# Patient Record
Sex: Female | Born: 1990 | Race: Black or African American | Hispanic: No | Marital: Single | Smoking: Light tobacco smoker
Health system: Southern US, Community
[De-identification: ages and names within clinical notes are randomized; demographics above are authoritative.]

## PROBLEM LIST (undated history)

## (undated) ENCOUNTER — Inpatient Hospital Stay: Payer: Self-pay

## (undated) DIAGNOSIS — O09299 Supervision of pregnancy with other poor reproductive or obstetric history, unspecified trimester: Secondary | ICD-10-CM

## (undated) DIAGNOSIS — I10 Essential (primary) hypertension: Secondary | ICD-10-CM

## (undated) DIAGNOSIS — K219 Gastro-esophageal reflux disease without esophagitis: Secondary | ICD-10-CM

## (undated) DIAGNOSIS — R569 Unspecified convulsions: Secondary | ICD-10-CM

## (undated) DIAGNOSIS — F329 Major depressive disorder, single episode, unspecified: Secondary | ICD-10-CM

## (undated) DIAGNOSIS — F32A Depression, unspecified: Secondary | ICD-10-CM

## (undated) DIAGNOSIS — E669 Obesity, unspecified: Secondary | ICD-10-CM

## (undated) DIAGNOSIS — E119 Type 2 diabetes mellitus without complications: Secondary | ICD-10-CM

## (undated) DIAGNOSIS — R8271 Bacteriuria: Secondary | ICD-10-CM

## (undated) HISTORY — PX: THERAPEUTIC ABORTION: SHX798

## (undated) HISTORY — PX: CHOLECYSTECTOMY: SHX55

---
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
## 2009-07-09 ENCOUNTER — Emergency Department: Payer: Self-pay | Admitting: Emergency Medicine

## 2009-09-16 ENCOUNTER — Encounter: Payer: Self-pay | Admitting: Emergency Medicine

## 2009-09-18 ENCOUNTER — Ambulatory Visit: Payer: Self-pay | Admitting: Psychiatry

## 2009-09-18 ENCOUNTER — Inpatient Hospital Stay (HOSPITAL_COMMUNITY): Admission: AD | Admit: 2009-09-18 | Discharge: 2009-09-20 | Payer: Self-pay | Admitting: Psychiatry

## 2009-11-19 ENCOUNTER — Emergency Department: Payer: Self-pay | Admitting: Emergency Medicine

## 2010-01-29 ENCOUNTER — Emergency Department: Payer: Self-pay | Admitting: Emergency Medicine

## 2010-01-31 ENCOUNTER — Emergency Department: Payer: Self-pay | Admitting: Emergency Medicine

## 2010-03-14 HISTORY — PX: CHOLECYSTECTOMY, LAPAROSCOPIC: SHX56

## 2010-03-20 ENCOUNTER — Emergency Department (HOSPITAL_COMMUNITY)
Admission: EM | Admit: 2010-03-20 | Discharge: 2010-03-20 | Payer: Self-pay | Source: Home / Self Care | Admitting: Emergency Medicine

## 2010-04-25 NOTE — Discharge Summary (Signed)
  NAMESAYDE, LISH NO.:  [id number]  MEDICAL RECORD NO.:  [id number]          PATIENT TYPE:  EMS  LOCATION:  ED                           FACILITY:  [facility]  PHYSICIAN:  Eulogio Ditch, MD DATE OF BIRTH:  04-04-1990  DATE OF ADMISSION:  03/20/2010 DATE OF DISCHARGE:  03/20/2010                              DISCHARGE SUMMARY   DISCHARGE SUMMARY:  A 20 year old single African American female who was admitted from [facility] ER and she reported depressed mood and was using marijuana.  She was having some visual hallucinations but as per the patient it was a misunderstanding.  Please refer to the initial psych assessment for other information.  HOSPITAL COURSE:  During the hospital stay, no medications were started. The patient's family was contacted and there was no safety concern the patient was discharged on September 20, 2009 to be followed in the outpatient setting.  DISCHARGE FOLLOWUP:  [facility] phone number [phone], appointment September 21, 2009 at 10:00 a.m..  DISCHARGE DIAGNOSES:  AXIS I: Psychosis not otherwise specified, marijuana abuse. AXIS II:  Borderline  IQ. AXIS III: preeclampsia. AXIS IV:  Psychosocial issues. AXIS V: Global assessment of functioning 60.  The patient was seen by the weekend team and was found to be stable for discharge.     Eulogio Ditch, MD     SA/MEDQ  D:  04/22/2010  T:  04/22/2010  Job:  [id number]  Electronically Signed by Eulogio Ditch  on 04/25/2010 01:57:08 PM

## 2010-05-01 ENCOUNTER — Emergency Department: Payer: Self-pay | Admitting: Emergency Medicine

## 2010-05-13 ENCOUNTER — Encounter: Payer: Self-pay | Admitting: Obstetrics and Gynecology

## 2010-05-18 NOTE — Discharge Summary (Addendum)
[name], [name]                ACCOUNT NO.:  [id number]  MEDICAL RECORD NO.:  [id number]           PATIENT TYPE:  LOCATION:                                 FACILITY:  PHYSICIAN:  Eulogio Ditch, MD DATE OF BIRTH:  1990-11-22  DATE OF ADMISSION:  09/18/2009 DATE OF DISCHARGE:  09/20/2009                              DISCHARGE SUMMARY   IDENTIFYING INFORMATION:  This is a 20 year old African American female. This is a voluntary admission.  HISTORY OF THE PRESENT ILLNESS:  [name] represented by way of our emergency room requesting to speak to somebody about her depression and was complaining of having chest pain that had been occurring for about 2 months.  She did endorse smoking marijuana for the first time within the previous week.  She reported that she was having problems with anxiety and stress due to family issues and her relationship with the father of her child.  She was admitted for stabilization.  FINDINGS:  She was admitted to our mood disorders program and admitted to having significant anxiety and some anger toward the father of her child and was concerned that she might harm him.  She was noted to be nonpsychotic and denied any intent to actually harm anyone and no suicidal thoughts.  She agreed to pursue outpatient counseling and was referred to [facility] and was to report there on July the 11th at 10:00 a.m. for a follow-up appointment.  She is in full contact with reality.  No dangerous ideas.  DISCHARGE CONDITION:  Stable.  DISCHARGE DIAGNOSES:  Axis I:  Depressive disorder not otherwise specified. Axis II:  No diagnosis. Axis III:  No diagnosis. Axis IV:  Ongoing parenting issues. Axis V:  Current 61.  Past year not known.  DISCHARGE MEDICATIONS:  She was discharged on no medications.     Margaret A. Lorin Picket, N.P.   ______________________________ Eulogio Ditch, MD    MAS/MEDQ  D:  05/14/2010  T:  05/14/2010  Job:   [id number]  Electronically Signed by Kari Baars N.P. on 05/17/2010 01:34:57 PM Electronically Signed by Eulogio Ditch  on 05/18/2010 09:57:44 AM Electronically Signed by Eulogio Ditch  on 05/18/2010 10:05:34 AM Electronically Signed by Eulogio Ditch  on 05/18/2010 10:14:47 AM Electronically Signed by Eulogio Ditch  on 05/18/2010 10:23:58 AM Electronically Signed by Eulogio Ditch  on 05/18/2010 10:32:58 AM Electronically Signed by Eulogio Ditch  on 05/18/2010 10:42:00 AM Electronically Signed by Eulogio Ditch  on 05/18/2010 10:51:29 AM Electronically Signed by Eulogio Ditch  on 05/18/2010 11:01:48 AM Electronically Signed by Eulogio Ditch  on 05/18/2010 11:11:42 AM Electronically Signed by Eulogio Ditch  on 05/18/2010 11:23:04 AM Electronically Signed by Eulogio Ditch  on 05/18/2010 11:36:07 AM Electronically Signed by Eulogio Ditch  on 05/18/2010 11:49:28 AM Electronically Signed by Eulogio Ditch  on 05/18/2010 12:03:57 PM Electronically Signed by Eulogio Ditch  on 05/18/2010 12:18:24 PM Electronically Signed by Eulogio Ditch  on 05/18/2010 12:34:39 PM Electronically Signed by Eulogio Ditch  on 05/18/2010 12:52:38 PM Electronically Signed by Eulogio Ditch  on 05/18/2010 01:10:39 PM Electronically Signed by Eulogio Ditch  on 05/18/2010 01:32:08 PM  Electronically Signed by Eulogio Ditch  on 05/18/2010 01:52:52 PM Electronically Signed by Eulogio Ditch  on 05/18/2010 02:12:47 PM Electronically Signed by Eulogio Ditch  on 05/18/2010 02:33:44 PM Electronically Signed by Eulogio Ditch  on 05/18/2010 02:57:06 PM Electronically Signed by Eulogio Ditch  on 05/18/2010 03:21:58 PM Electronically Signed by Eulogio Ditch  on 05/18/2010 03:46:14 PM Electronically Signed by Eulogio Ditch  on 05/18/2010 04:23:58 PM Electronically Signed by Eulogio Ditch  on 05/18/2010 04:55:16  PM Electronically Signed by Eulogio Ditch  on 05/18/2010 07:43:20 PM

## 2010-05-30 LAB — BASIC METABOLIC PANEL
BUN: 7 mg/dL (ref 6–23)
CO2: 24 mEq/L (ref 19–32)
Chloride: 109 mEq/L (ref 96–112)
Creatinine, Ser: 0.66 mg/dL (ref 0.4–1.2)
GFR calc non Af Amer: >60 mL/min (ref >60)

## 2010-05-30 LAB — CBC
Hemoglobin: 13.1 g/dL (ref 12.0–15.0)
MCH: 30.2 pg (ref 26.0–34.0)
MCV: 89 fL (ref 78.0–100.0)
RBC: 4.35 MIL/uL (ref 3.87–5.11)
WBC: 7.1 10*3/uL (ref 4.0–10.5)

## 2010-05-30 LAB — DIFFERENTIAL
Eosinophils Relative: 1 % (ref 0–5)
Lymphocytes Relative: 27 % (ref 12–46)
Monocytes Absolute: 0.3 10*3/uL (ref 0.1–1.0)
Monocytes Relative: 4 % (ref 3–12)
Neutro Abs: 4.8 10*3/uL (ref 1.7–7.7)
Neutrophils Relative %: 68 % (ref 43–77)

## 2010-05-30 LAB — RAPID URINE DRUG SCREEN, HOSP PERFORMED
Amphetamines: NOT DETECTED
Barbiturates: NOT DETECTED
Benzodiazepines: NOT DETECTED
Tetrahydrocannabinol: POSITIVE — AB

## 2010-05-30 LAB — URINALYSIS, ROUTINE W REFLEX MICROSCOPIC
Ketones, ur: NEGATIVE mg/dL
Leukocytes, UA: NEGATIVE
Nitrite: NEGATIVE

## 2010-05-30 LAB — URINE MICROSCOPIC-ADD ON

## 2010-07-04 ENCOUNTER — Observation Stay: Payer: Self-pay | Admitting: Obstetrics and Gynecology

## 2010-07-15 ENCOUNTER — Encounter: Payer: Self-pay | Admitting: Obstetrics & Gynecology

## 2010-07-22 ENCOUNTER — Encounter: Payer: Self-pay | Admitting: Obstetrics and Gynecology

## 2010-08-05 ENCOUNTER — Encounter: Payer: Self-pay | Admitting: Obstetrics & Gynecology

## 2010-08-12 ENCOUNTER — Encounter: Payer: Self-pay | Admitting: Maternal and Fetal Medicine

## 2010-08-16 ENCOUNTER — Encounter: Payer: Self-pay | Admitting: Obstetrics and Gynecology

## 2010-08-19 ENCOUNTER — Encounter: Payer: Self-pay | Admitting: Maternal & Fetal Medicine

## 2010-08-23 ENCOUNTER — Encounter: Payer: Self-pay | Admitting: Obstetrics & Gynecology

## 2010-08-26 ENCOUNTER — Encounter: Payer: Self-pay | Admitting: Obstetrics and Gynecology

## 2010-08-30 ENCOUNTER — Encounter: Payer: Self-pay | Admitting: Obstetrics and Gynecology

## 2010-09-08 ENCOUNTER — Observation Stay: Payer: Self-pay | Admitting: Obstetrics & Gynecology

## 2010-09-09 ENCOUNTER — Encounter: Payer: Self-pay | Admitting: Maternal and Fetal Medicine

## 2010-09-13 ENCOUNTER — Encounter: Payer: Self-pay | Admitting: Maternal & Fetal Medicine

## 2010-09-16 ENCOUNTER — Encounter: Payer: Self-pay | Admitting: Obstetrics and Gynecology

## 2010-09-17 ENCOUNTER — Inpatient Hospital Stay: Payer: Self-pay

## 2010-09-25 ENCOUNTER — Emergency Department: Payer: Self-pay | Admitting: Emergency Medicine

## 2011-02-02 ENCOUNTER — Emergency Department: Payer: Self-pay | Admitting: Emergency Medicine

## 2011-02-19 ENCOUNTER — Emergency Department: Payer: Self-pay | Admitting: Internal Medicine

## 2011-03-16 ENCOUNTER — Emergency Department: Payer: Self-pay | Admitting: Emergency Medicine

## 2011-03-16 LAB — CBC
HCT: 35 % (ref 35.0–47.0)
HGB: 11.6 g/dL — ABNORMAL LOW (ref 12.0–16.0)
MCH: 29.1 pg (ref 26.0–34.0)
MCHC: 33.2 g/dL (ref 32.0–36.0)

## 2011-03-16 LAB — URINALYSIS, COMPLETE
Bacteria: NONE SEEN
Bilirubin,UR: NEGATIVE
Glucose,UR: NEGATIVE mg/dL (ref 0–75)
Ketone: NEGATIVE
Leukocyte Esterase: NEGATIVE
RBC,UR: NONE SEEN /HPF (ref 0–5)
Squamous Epithelial: 2
WBC UR: <1 /HPF (ref 0–5)

## 2011-03-16 LAB — COMPREHENSIVE METABOLIC PANEL
Albumin: 3.8 g/dL (ref 3.4–5.0)
BUN: 11 mg/dL (ref 7–18)
Bilirubin,Total: 0.2 mg/dL (ref 0.2–1.0)
Chloride: 110 mmol/L — ABNORMAL HIGH (ref 98–107)
Co2: 27 mmol/L (ref 21–32)
Creatinine: 0.81 mg/dL (ref 0.60–1.30)
EGFR (African American): >60
SGPT (ALT): 17 U/L
Sodium: 145 mmol/L (ref 136–145)
Total Protein: 7.4 g/dL (ref 6.4–8.2)

## 2011-03-16 LAB — LIPASE, BLOOD: Lipase: 145 U/L (ref 73–393)

## 2011-03-16 LAB — PREGNANCY, URINE: Pregnancy Test, Urine: NEGATIVE m[IU]/mL

## 2011-04-30 ENCOUNTER — Emergency Department: Payer: Self-pay | Admitting: Emergency Medicine

## 2011-04-30 LAB — CBC
HCT: 37.1 % (ref 35.0–47.0)
MCH: 29 pg (ref 26.0–34.0)
MCHC: 32.7 g/dL (ref 32.0–36.0)
MCV: 89 fL (ref 80–100)
RDW: 14.8 % — ABNORMAL HIGH (ref 11.5–14.5)
WBC: 6.7 10*3/uL (ref 3.6–11.0)

## 2011-04-30 LAB — COMPREHENSIVE METABOLIC PANEL
Albumin: 4.2 g/dL (ref 3.4–5.0)
Anion Gap: 11 (ref 7–16)
BUN: 9 mg/dL (ref 7–18)
Bilirubin,Total: 0.2 mg/dL (ref 0.2–1.0)
Calcium, Total: 8.8 mg/dL (ref 8.5–10.1)
Chloride: 105 mmol/L (ref 98–107)
Co2: 27 mmol/L (ref 21–32)
Creatinine: 0.9 mg/dL (ref 0.60–1.30)
EGFR (African American): >60
EGFR (Non-African Amer.): >60
Potassium: 3.5 mmol/L (ref 3.5–5.1)
Total Protein: 8.1 g/dL (ref 6.4–8.2)

## 2011-04-30 LAB — LIPASE, BLOOD: Lipase: 182 U/L (ref 73–393)

## 2011-06-05 ENCOUNTER — Emergency Department: Payer: Self-pay | Admitting: Unknown Physician Specialty

## 2011-06-05 LAB — LIPASE, BLOOD: Lipase: 122 U/L (ref 73–393)

## 2011-06-05 LAB — CBC
HCT: 35.3 % (ref 35.0–47.0)
HGB: 11.6 g/dL — ABNORMAL LOW (ref 12.0–16.0)
MCH: 29.3 pg (ref 26.0–34.0)
MCHC: 33 g/dL (ref 32.0–36.0)
MCV: 89 fL (ref 80–100)
Platelet: 296 10*3/uL (ref 150–440)
RDW: 14.5 % (ref 11.5–14.5)
WBC: 10.4 10*3/uL (ref 3.6–11.0)

## 2011-06-05 LAB — COMPREHENSIVE METABOLIC PANEL
Albumin: 4.2 g/dL (ref 3.4–5.0)
Anion Gap: 12 (ref 7–16)
BUN: 10 mg/dL (ref 7–18)
Chloride: 104 mmol/L (ref 98–107)
EGFR (Non-African Amer.): >60
Osmolality: 284 (ref 275–301)
Potassium: 3.8 mmol/L (ref 3.5–5.1)
SGOT(AST): 18 U/L (ref 15–37)
SGPT (ALT): 16 U/L
Sodium: 143 mmol/L (ref 136–145)
Total Protein: 8.3 g/dL — ABNORMAL HIGH (ref 6.4–8.2)

## 2011-06-05 LAB — HCG, QUANTITATIVE, PREGNANCY: Beta Hcg, Quant.: <1 m[IU]/mL — ABNORMAL LOW

## 2011-06-22 ENCOUNTER — Emergency Department: Payer: Self-pay | Admitting: *Deleted

## 2011-06-22 LAB — COMPREHENSIVE METABOLIC PANEL
Albumin: 3.7 g/dL (ref 3.4–5.0)
Chloride: 107 mmol/L (ref 98–107)
Co2: 24 mmol/L (ref 21–32)
Creatinine: 0.8 mg/dL (ref 0.60–1.30)
EGFR (African American): >60
EGFR (Non-African Amer.): >60
Potassium: 4 mmol/L (ref 3.5–5.1)
SGPT (ALT): 14 U/L
Sodium: 139 mmol/L (ref 136–145)
Total Protein: 7.2 g/dL (ref 6.4–8.2)

## 2011-06-22 LAB — URINALYSIS, COMPLETE
Blood: NEGATIVE
Glucose,UR: NEGATIVE mg/dL (ref 0–75)
Ketone: NEGATIVE
Nitrite: NEGATIVE
Protein: NEGATIVE
RBC,UR: 1 /HPF (ref 0–5)
Specific Gravity: 1.025 (ref 1.003–1.030)
Squamous Epithelial: 5

## 2011-06-22 LAB — CBC
HGB: 10.7 g/dL — ABNORMAL LOW (ref 12.0–16.0)
MCV: 88 fL (ref 80–100)
RBC: 3.72 10*6/uL — ABNORMAL LOW (ref 3.80–5.20)
RDW: 14.6 % — ABNORMAL HIGH (ref 11.5–14.5)

## 2011-06-22 LAB — PREGNANCY, URINE: Pregnancy Test, Urine: NEGATIVE m[IU]/mL

## 2011-06-27 ENCOUNTER — Emergency Department: Payer: Self-pay | Admitting: Emergency Medicine

## 2011-08-09 ENCOUNTER — Emergency Department: Payer: Self-pay | Admitting: Emergency Medicine

## 2011-08-09 LAB — COMPREHENSIVE METABOLIC PANEL
Anion Gap: 8 (ref 7–16)
BUN: 9 mg/dL (ref 7–18)
Bilirubin,Total: 0.1 mg/dL — ABNORMAL LOW (ref 0.2–1.0)
Chloride: 106 mmol/L (ref 98–107)
Co2: 27 mmol/L (ref 21–32)
EGFR (African American): >60
Glucose: 74 mg/dL (ref 65–99)
Potassium: 4.4 mmol/L (ref 3.5–5.1)
SGOT(AST): 16 U/L (ref 15–37)

## 2011-08-09 LAB — CBC
MCH: 27 pg (ref 26.0–34.0)
MCV: 85 fL (ref 80–100)
Platelet: 305 10*3/uL (ref 150–440)
RDW: 14.5 % (ref 11.5–14.5)

## 2011-08-25 ENCOUNTER — Emergency Department: Payer: Self-pay | Admitting: Unknown Physician Specialty

## 2011-08-25 LAB — CBC WITH DIFFERENTIAL/PLATELET
Basophil #: 0 10*3/uL (ref 0.0–0.1)
Eosinophil #: 0.2 10*3/uL (ref 0.0–0.7)
Lymphocyte #: 3.1 10*3/uL (ref 1.0–3.6)
MCH: 26.9 pg (ref 26.0–34.0)
MCHC: 32.3 g/dL (ref 32.0–36.0)
Monocyte %: 4.1 %
Platelet: 362 10*3/uL (ref 150–440)
RDW: 14.4 % (ref 11.5–14.5)
WBC: 6.6 10*3/uL (ref 3.6–11.0)

## 2011-08-25 LAB — COMPREHENSIVE METABOLIC PANEL
Anion Gap: 11 (ref 7–16)
BUN: 15 mg/dL (ref 7–18)
Calcium, Total: 9.4 mg/dL (ref 8.5–10.1)
Chloride: 106 mmol/L (ref 98–107)
Co2: 24 mmol/L (ref 21–32)
Creatinine: 0.91 mg/dL (ref 0.60–1.30)
EGFR (African American): >60
EGFR (Non-African Amer.): >60
Osmolality: 282 (ref 275–301)
SGOT(AST): 18 U/L (ref 15–37)

## 2011-09-07 ENCOUNTER — Emergency Department: Payer: Self-pay | Admitting: Emergency Medicine

## 2011-09-07 LAB — COMPREHENSIVE METABOLIC PANEL
Albumin: 4 g/dL (ref 3.4–5.0)
Alkaline Phosphatase: 119 U/L (ref 50–136)
Anion Gap: 9 (ref 7–16)
Chloride: 108 mmol/L — ABNORMAL HIGH (ref 98–107)
EGFR (African American): >60
EGFR (Non-African Amer.): >60
Glucose: 125 mg/dL — ABNORMAL HIGH (ref 65–99)
Osmolality: 280 (ref 275–301)
Sodium: 140 mmol/L (ref 136–145)

## 2011-09-07 LAB — TSH: Thyroid Stimulating Horm: 0.48 u[IU]/mL

## 2011-09-07 LAB — URINALYSIS, COMPLETE
Bilirubin,UR: NEGATIVE
Blood: NEGATIVE
Leukocyte Esterase: NEGATIVE
Nitrite: NEGATIVE
Ph: 7 (ref 4.5–8.0)
Protein: 100
RBC,UR: 1 /HPF (ref 0–5)
Specific Gravity: 1.006 (ref 1.003–1.030)
Squamous Epithelial: 1
WBC UR: 3 /HPF (ref 0–5)

## 2011-09-07 LAB — CBC
HGB: 10.4 g/dL — ABNORMAL LOW (ref 12.0–16.0)
MCHC: 32.1 g/dL (ref 32.0–36.0)
MCV: 82 fL (ref 80–100)
Platelet: 281 10*3/uL (ref 150–440)
RDW: 14.9 % — ABNORMAL HIGH (ref 11.5–14.5)
WBC: 9.2 10*3/uL (ref 3.6–11.0)

## 2011-09-07 LAB — DRUG SCREEN, URINE
Amphetamines, Ur Screen: NEGATIVE (ref ?–1000)
Barbiturates, Ur Screen: NEGATIVE (ref ?–200)
Benzodiazepine, Ur Scrn: NEGATIVE (ref ?–200)
Cocaine Metabolite,Ur ~~LOC~~: NEGATIVE (ref ?–300)
MDMA (Ecstasy)Ur Screen: NEGATIVE (ref ?–500)
Methadone, Ur Screen: NEGATIVE (ref ?–300)
Phencyclidine (PCP) Ur S: NEGATIVE (ref ?–25)
Tricyclic, Ur Screen: NEGATIVE (ref ?–1000)

## 2011-09-16 LAB — CBC
HCT: 33 % — ABNORMAL LOW (ref 35.0–47.0)
HGB: 10.8 g/dL — ABNORMAL LOW (ref 12.0–16.0)
MCH: 26.9 pg (ref 26.0–34.0)
MCHC: 32.7 g/dL (ref 32.0–36.0)
MCV: 82 fL (ref 80–100)

## 2011-09-16 LAB — COMPREHENSIVE METABOLIC PANEL
Alkaline Phosphatase: 451 U/L — ABNORMAL HIGH (ref 50–136)
Anion Gap: 5 — ABNORMAL LOW (ref 7–16)
Calcium, Total: 9.6 mg/dL (ref 8.5–10.1)
Co2: 26 mmol/L (ref 21–32)
Creatinine: 0.86 mg/dL (ref 0.60–1.30)
EGFR (African American): >60
EGFR (Non-African Amer.): >60
Osmolality: 274 (ref 275–301)
Potassium: 3.8 mmol/L (ref 3.5–5.1)
Total Protein: 8.6 g/dL — ABNORMAL HIGH (ref 6.4–8.2)

## 2011-09-16 LAB — LIPASE, BLOOD: Lipase: 128 U/L (ref 73–393)

## 2011-09-17 ENCOUNTER — Inpatient Hospital Stay: Payer: Self-pay | Admitting: Surgery

## 2011-09-17 LAB — URINALYSIS, COMPLETE
Glucose,UR: NEGATIVE mg/dL (ref 0–75)
Leukocyte Esterase: NEGATIVE
Ph: 5 (ref 4.5–8.0)
RBC,UR: 2 /HPF (ref 0–5)
Specific Gravity: 1.028 (ref 1.003–1.030)
Squamous Epithelial: 3

## 2011-09-18 LAB — CBC WITH DIFFERENTIAL/PLATELET
Basophil #: 0 10*3/uL (ref 0.0–0.1)
Eosinophil %: 3.1 %
Lymphocyte %: 47.8 %
MCH: 26.6 pg (ref 26.0–34.0)
MCHC: 32.1 g/dL (ref 32.0–36.0)
Monocyte #: 0.2 x10 3/mm (ref 0.2–0.9)
Neutrophil %: 41.6 %
Platelet: 262 10*3/uL (ref 150–440)
RDW: 17.1 % — ABNORMAL HIGH (ref 11.5–14.5)

## 2011-09-18 LAB — PROTIME-INR
INR: 1
Prothrombin Time: 13.1 secs (ref 11.5–14.7)

## 2011-09-18 LAB — COMPREHENSIVE METABOLIC PANEL
Anion Gap: 6 — ABNORMAL LOW (ref 7–16)
BUN: 6 mg/dL — ABNORMAL LOW (ref 7–18)
Bilirubin,Total: 1.1 mg/dL — ABNORMAL HIGH (ref 0.2–1.0)
Chloride: 109 mmol/L — ABNORMAL HIGH (ref 98–107)
Creatinine: 0.81 mg/dL (ref 0.60–1.30)
EGFR (African American): >60
Glucose: 84 mg/dL (ref 65–99)
Osmolality: 280 (ref 275–301)
Potassium: 3.9 mmol/L (ref 3.5–5.1)
SGOT(AST): 120 U/L — ABNORMAL HIGH (ref 15–37)
SGPT (ALT): 368 U/L — ABNORMAL HIGH
Sodium: 142 mmol/L (ref 136–145)
Total Protein: 7 g/dL (ref 6.4–8.2)

## 2011-09-18 LAB — APTT: Activated PTT: 31.1 secs (ref 23.6–35.9)

## 2011-09-18 LAB — HEMOGLOBIN: HGB: 5 g/dL — CL (ref 12.0–16.0)

## 2011-09-18 LAB — PREGNANCY, URINE: Pregnancy Test, Urine: NEGATIVE m[IU]/mL

## 2011-09-19 LAB — COMPREHENSIVE METABOLIC PANEL
Alkaline Phosphatase: 167 U/L — ABNORMAL HIGH (ref 50–136)
Chloride: 110 mmol/L — ABNORMAL HIGH (ref 98–107)
Co2: 24 mmol/L (ref 21–32)
Creatinine: 0.85 mg/dL (ref 0.60–1.30)
EGFR (African American): >60
EGFR (Non-African Amer.): >60
Potassium: 4.5 mmol/L (ref 3.5–5.1)
SGOT(AST): 130 U/L — ABNORMAL HIGH (ref 15–37)
SGPT (ALT): 255 U/L — ABNORMAL HIGH
Sodium: 143 mmol/L (ref 136–145)
Total Protein: 4.5 g/dL — ABNORMAL LOW (ref 6.4–8.2)

## 2011-09-19 LAB — CBC WITH DIFFERENTIAL/PLATELET
Basophil #: 0.1 10*3/uL (ref 0.0–0.1)
Basophil %: 0.3 %
Eosinophil %: 0 %
HCT: 30.3 % — ABNORMAL LOW (ref 35.0–47.0)
HGB: 10.4 g/dL — ABNORMAL LOW (ref 12.0–16.0)
Lymphocyte #: 1.6 10*3/uL (ref 1.0–3.6)
Lymphocyte %: 6.8 %
MCH: 30 pg (ref 26.0–34.0)
Monocyte %: 3.9 %
Platelet: 147 10*3/uL — ABNORMAL LOW (ref 150–440)
RBC: 3.46 10*6/uL — ABNORMAL LOW (ref 3.80–5.20)
RDW: 16.4 % — ABNORMAL HIGH (ref 11.5–14.5)
WBC: 23.7 10*3/uL — ABNORMAL HIGH (ref 3.6–11.0)

## 2011-09-19 LAB — HEMOGLOBIN: HGB: 8.4 g/dL — ABNORMAL LOW (ref 12.0–16.0)

## 2011-09-20 LAB — CBC WITH DIFFERENTIAL/PLATELET
Basophil #: 0 10*3/uL (ref 0.0–0.1)
HGB: 9.2 g/dL — ABNORMAL LOW (ref 12.0–16.0)
Lymphocyte #: 1.6 10*3/uL (ref 1.0–3.6)
MCH: 30.6 pg (ref 26.0–34.0)
MCHC: 35.3 g/dL (ref 32.0–36.0)
MCV: 87 fL (ref 80–100)
Monocyte #: 0.4 x10 3/mm (ref 0.2–0.9)
Monocyte %: 5 %
Neutrophil #: 6.8 10*3/uL — ABNORMAL HIGH (ref 1.4–6.5)
Neutrophil %: 76.5 %
Platelet: 110 10*3/uL — ABNORMAL LOW (ref 150–440)
RBC: 3 10*6/uL — ABNORMAL LOW (ref 3.80–5.20)
RDW: 15.9 % — ABNORMAL HIGH (ref 11.5–14.5)

## 2011-09-20 LAB — COMPREHENSIVE METABOLIC PANEL
Alkaline Phosphatase: 140 U/L — ABNORMAL HIGH (ref 50–136)
Bilirubin,Total: 0.6 mg/dL (ref 0.2–1.0)
Calcium, Total: 7.3 mg/dL — ABNORMAL LOW (ref 8.5–10.1)
Chloride: 108 mmol/L — ABNORMAL HIGH (ref 98–107)
Co2: 25 mmol/L (ref 21–32)
EGFR (Non-African Amer.): >60
Glucose: 111 mg/dL — ABNORMAL HIGH (ref 65–99)
Potassium: 3.8 mmol/L (ref 3.5–5.1)
SGOT(AST): 47 U/L — ABNORMAL HIGH (ref 15–37)
Sodium: 141 mmol/L (ref 136–145)
Total Protein: 4.8 g/dL — ABNORMAL LOW (ref 6.4–8.2)

## 2011-09-20 LAB — PATHOLOGY REPORT

## 2011-09-21 LAB — CBC WITH DIFFERENTIAL/PLATELET
Basophil #: 0 10*3/uL (ref 0.0–0.1)
Eosinophil %: 3 %
Lymphocyte #: 2 10*3/uL (ref 1.0–3.6)
Lymphocyte %: 25.7 %
MCH: 30.5 pg (ref 26.0–34.0)
MCHC: 34.7 g/dL (ref 32.0–36.0)
MCV: 88 fL (ref 80–100)
Monocyte %: 5.7 %
Platelet: 136 10*3/uL — ABNORMAL LOW (ref 150–440)
RBC: 2.85 10*6/uL — ABNORMAL LOW (ref 3.80–5.20)
RDW: 16.4 % — ABNORMAL HIGH (ref 11.5–14.5)

## 2011-09-21 LAB — COMPREHENSIVE METABOLIC PANEL
Albumin: 2.5 g/dL — ABNORMAL LOW (ref 3.4–5.0)
Alkaline Phosphatase: 131 U/L (ref 50–136)
Anion Gap: 7 (ref 7–16)
Bilirubin,Total: 0.5 mg/dL (ref 0.2–1.0)
Calcium, Total: 8.5 mg/dL (ref 8.5–10.1)
Chloride: 106 mmol/L (ref 98–107)
Co2: 30 mmol/L (ref 21–32)
EGFR (Non-African Amer.): >60
Osmolality: 282 (ref 275–301)
SGOT(AST): 29 U/L (ref 15–37)
SGPT (ALT): 104 U/L — ABNORMAL HIGH
Total Protein: 5.7 g/dL — ABNORMAL LOW (ref 6.4–8.2)

## 2011-09-22 LAB — CBC WITH DIFFERENTIAL/PLATELET
Basophil #: 0 x10 3/mm 3
Basophil %: 0.2 %
Eosinophil #: 0.2 x10 3/mm 3
Eosinophil %: 3.5 %
HCT: 28 % — ABNORMAL LOW
HGB: 9.6 g/dL — ABNORMAL LOW
Lymphocyte %: 15.2 %
Lymphs Abs: 1 x10 3/mm 3
MCH: 30.4 pg
MCHC: 34.1 g/dL
MCV: 89 fL
Monocyte #: 0.3 "x10 3/mm "
Monocyte %: 5.1 %
Neutrophil #: 5.2 x10 3/mm 3
Neutrophil %: 76 %
Platelet: 180 x10 3/mm 3
RBC: 3.15 X10 6/mm 3 — ABNORMAL LOW
RDW: 17 % — ABNORMAL HIGH
WBC: 6.8 x10 3/mm 3

## 2011-10-05 ENCOUNTER — Other Ambulatory Visit: Payer: Self-pay | Admitting: Surgery

## 2011-10-05 LAB — COMPREHENSIVE METABOLIC PANEL
Anion Gap: 8 (ref 7–16)
BUN: 10 mg/dL (ref 7–18)
Bilirubin,Total: 0.4 mg/dL (ref 0.2–1.0)
Calcium, Total: 8.8 mg/dL (ref 8.5–10.1)
Co2: 25 mmol/L (ref 21–32)
Creatinine: 0.76 mg/dL (ref 0.60–1.30)
EGFR (African American): >60
EGFR (Non-African Amer.): >60
Glucose: 90 mg/dL (ref 65–99)
Potassium: 3.4 mmol/L — ABNORMAL LOW (ref 3.5–5.1)
SGPT (ALT): 19 U/L
Sodium: 140 mmol/L (ref 136–145)

## 2011-10-05 LAB — CBC WITH DIFFERENTIAL/PLATELET
Basophil %: 0.4 %
Eosinophil #: 0.2 10*3/uL (ref 0.0–0.7)
HCT: 33.9 % — ABNORMAL LOW (ref 35.0–47.0)
HGB: 10.8 g/dL — ABNORMAL LOW (ref 12.0–16.0)
MCH: 28 pg (ref 26.0–34.0)
MCHC: 31.7 g/dL — ABNORMAL LOW (ref 32.0–36.0)
Monocyte #: 0.4 x10 3/mm (ref 0.2–0.9)
Neutrophil #: 5.5 10*3/uL (ref 1.4–6.5)
Platelet: 379 10*3/uL (ref 150–440)
RDW: 15.6 % — ABNORMAL HIGH (ref 11.5–14.5)
WBC: 8.7 10*3/uL (ref 3.6–11.0)

## 2011-10-05 LAB — URINALYSIS, COMPLETE
Bilirubin,UR: NEGATIVE
RBC,UR: <1 /HPF (ref 0–5)
Specific Gravity: 1.027 (ref 1.003–1.030)
Squamous Epithelial: 3
WBC UR: 1 /HPF (ref 0–5)

## 2012-02-14 ENCOUNTER — Inpatient Hospital Stay: Payer: Self-pay | Admitting: Psychiatry

## 2012-02-14 LAB — URINALYSIS, COMPLETE
Blood: NEGATIVE
Nitrite: NEGATIVE
Ph: 6 (ref 4.5–8.0)
Protein: 100
Specific Gravity: 1.029 (ref 1.003–1.030)
WBC UR: 2 /HPF (ref 0–5)

## 2012-02-14 LAB — COMPREHENSIVE METABOLIC PANEL
Alkaline Phosphatase: 80 U/L (ref 50–136)
BUN: 12 mg/dL (ref 7–18)
Bilirubin,Total: 0.4 mg/dL (ref 0.2–1.0)
Chloride: 104 mmol/L (ref 98–107)
Creatinine: 0.68 mg/dL (ref 0.60–1.30)
EGFR (Non-African Amer.): >60
Glucose: 93 mg/dL (ref 65–99)
SGOT(AST): 24 U/L (ref 15–37)
SGPT (ALT): 17 U/L (ref 12–78)
Total Protein: 8.2 g/dL (ref 6.4–8.2)

## 2012-02-14 LAB — CBC
HCT: 36.3 % (ref 35.0–47.0)
MCH: 27.3 pg (ref 26.0–34.0)
MCHC: 32.5 g/dL (ref 32.0–36.0)
Platelet: 276 10*3/uL (ref 150–440)
RDW: 16.2 % — ABNORMAL HIGH (ref 11.5–14.5)

## 2012-02-14 LAB — DRUG SCREEN, URINE
Amphetamines, Ur Screen: NEGATIVE (ref ?–1000)
Barbiturates, Ur Screen: NEGATIVE (ref ?–200)
Benzodiazepine, Ur Scrn: NEGATIVE (ref ?–200)
Cannabinoid 50 Ng, Ur ~~LOC~~: POSITIVE (ref ?–50)
Cocaine Metabolite,Ur ~~LOC~~: NEGATIVE (ref ?–300)
Methadone, Ur Screen: NEGATIVE (ref ?–300)
Phencyclidine (PCP) Ur S: NEGATIVE (ref ?–25)
Tricyclic, Ur Screen: NEGATIVE (ref ?–1000)

## 2012-02-14 LAB — ETHANOL: Ethanol: <3 mg/dL

## 2012-07-16 IMAGING — CT CT HEAD WITHOUT CONTRAST
2 series · 16 of 30 positions shown, 20 images · non-contrast
Comparison: none

REASON FOR EXAM: seizure like activity
COMMENTS:

PROCEDURE:     CT  - CT HEAD WITHOUT CONTRAST  - September 08, 2011  [DATE]
RESULT:
HISTORY: Seizure.
Comparison Study: Prior CT of 09/25/2010.
PROCEDURE AND FINDINGS:  Standard nonenhanced CT is obtained. There is no
mass. There is no hydrocephalus. There is no hemorrhage. The exam is stable
from prior exam.

[Series 2: without · axial · non-contrast · 0.39mm/px · z∈[+1178,+1298]mm · 13 of 29 slices shown, 17 images]
[im 3/29  brain]
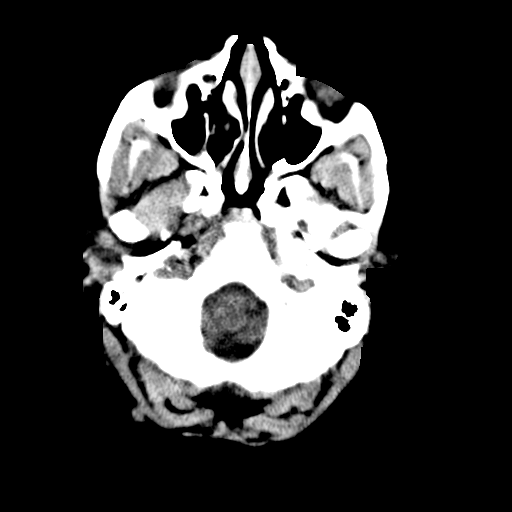
[im 3/29  bone]
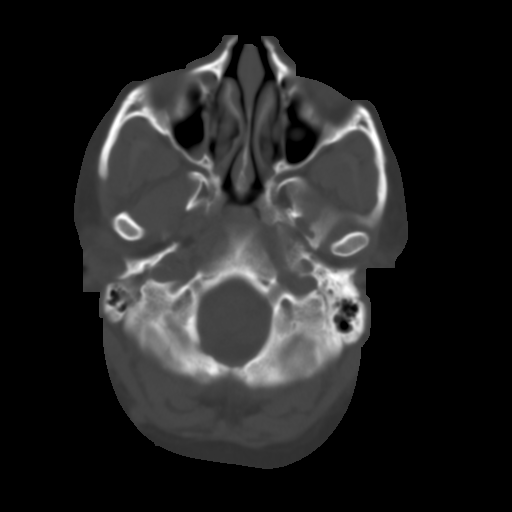
[im 5/29  brain]
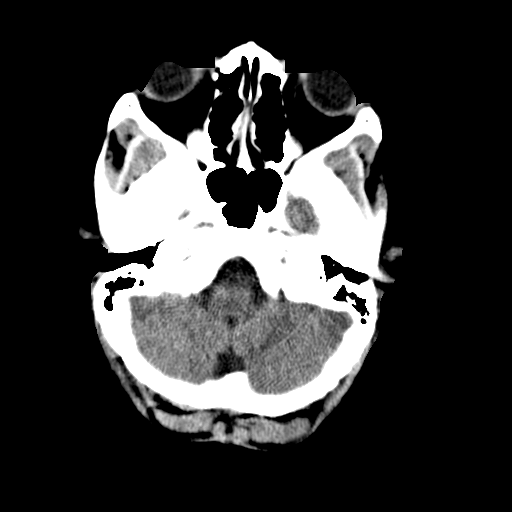
[im 7/29  brain]
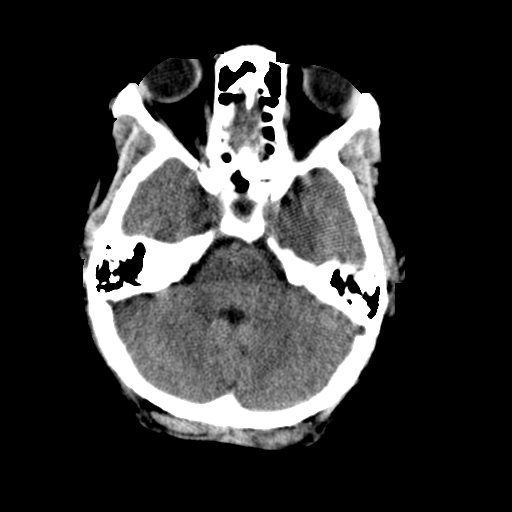
[im 9/29  brain]
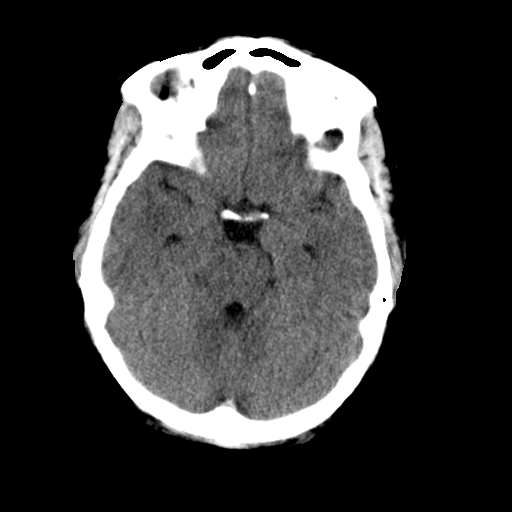
[im 11/29  brain]
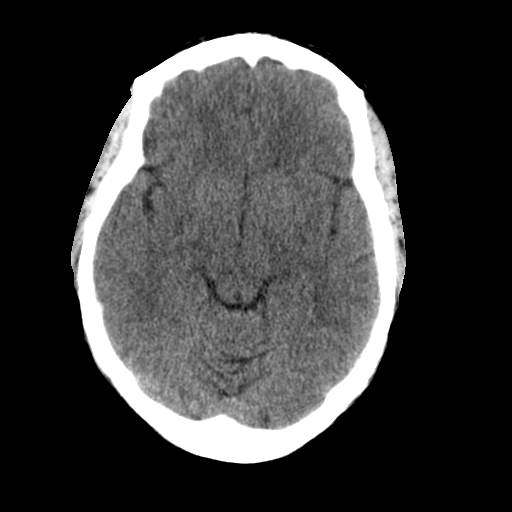
[im 11/29  bone]
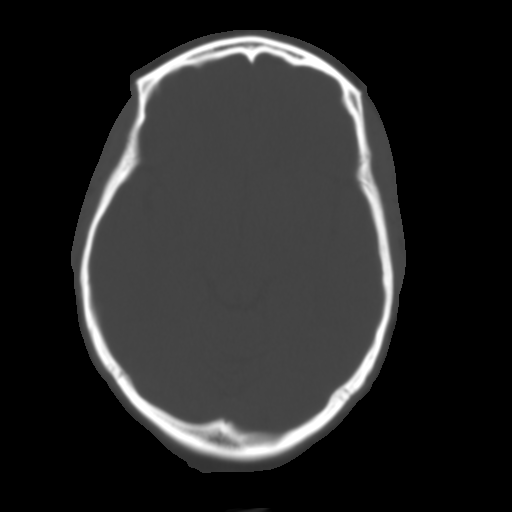
[im 13/29  brain]
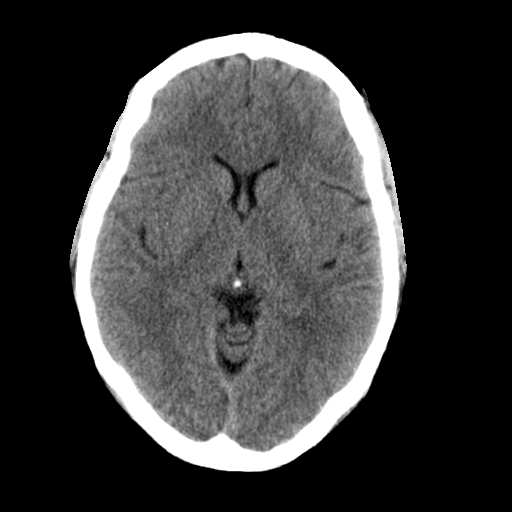
[im 15/29  brain]
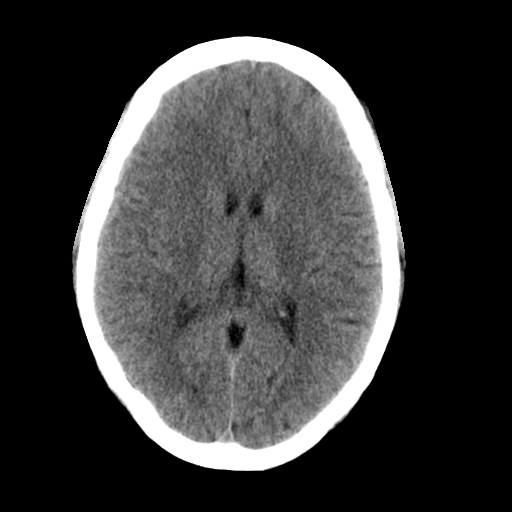
[im 17/29  brain]
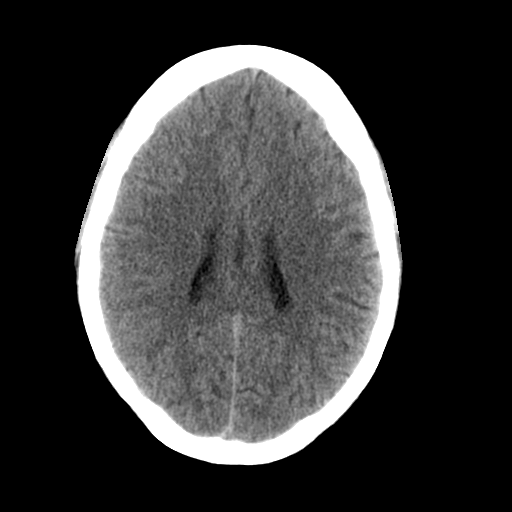
[im 19/29  brain]
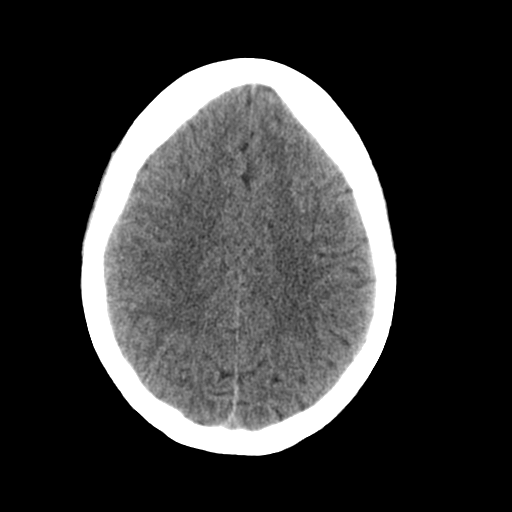
[im 19/29  bone]
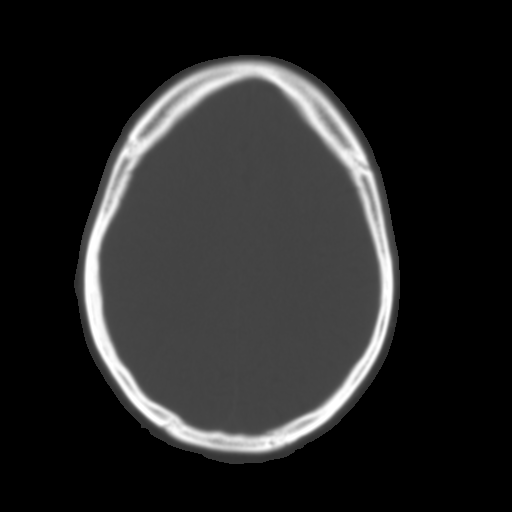
[im 21/29  brain]
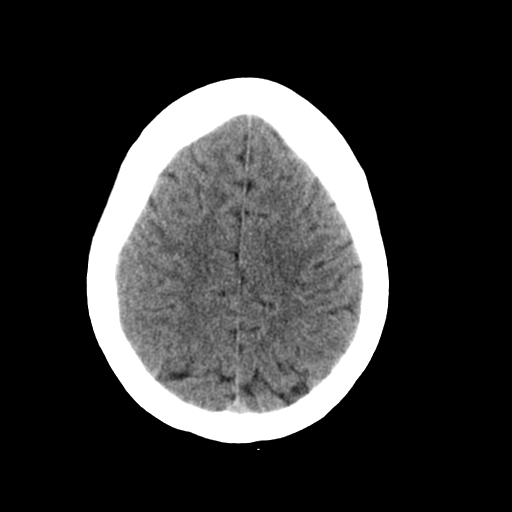
[im 23/29  brain]
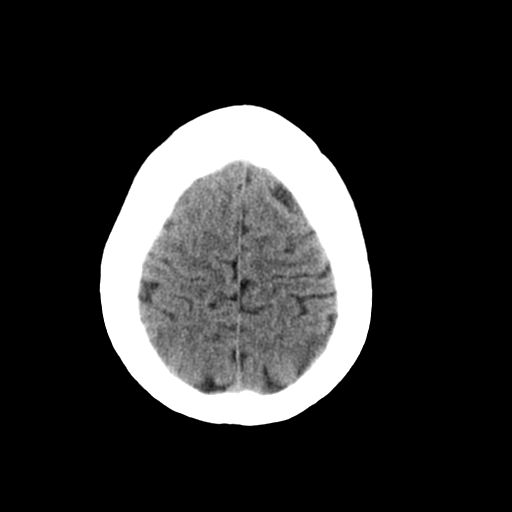
[im 25/29  brain]
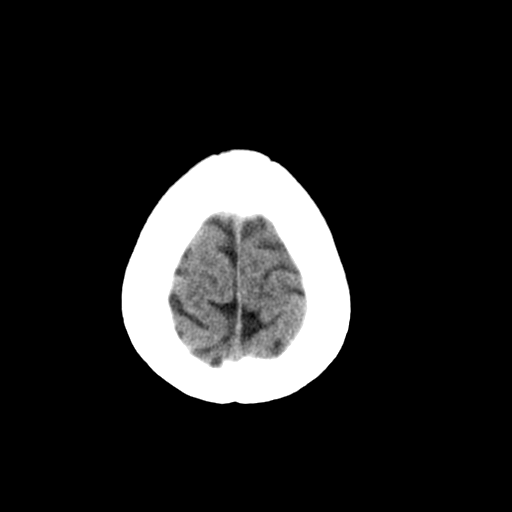
[im 27/29  brain]
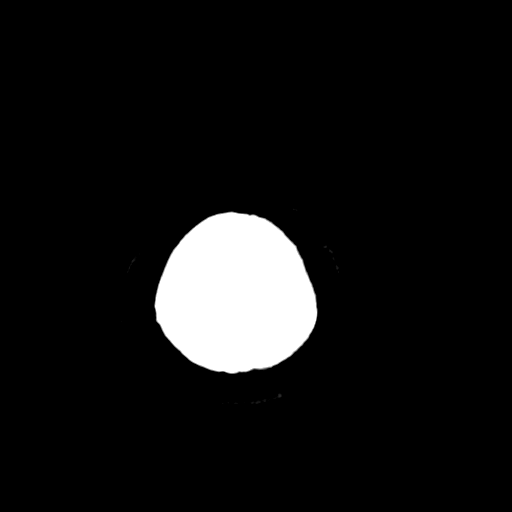
[im 27/29  bone]
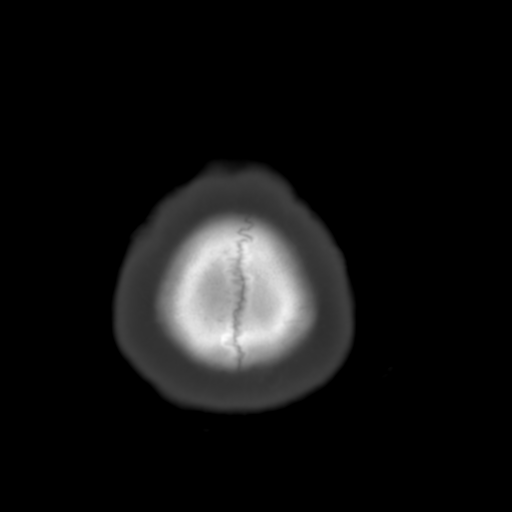

[Series 3: bone · axial · 0.39mm/px · z∈[+1178,+1218]mm · 3 of 29 slices shown]
[im 3/29  bone]
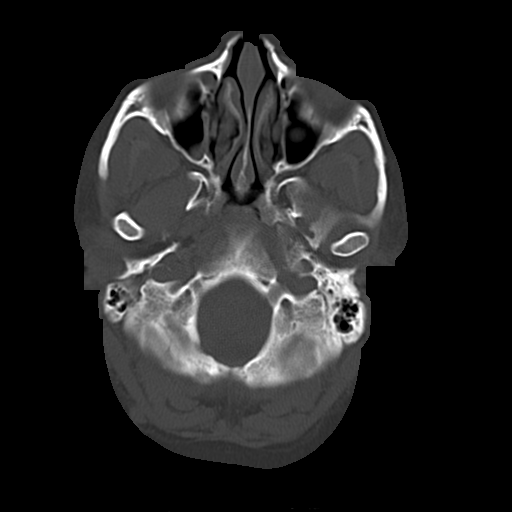
[im 7/29  bone]
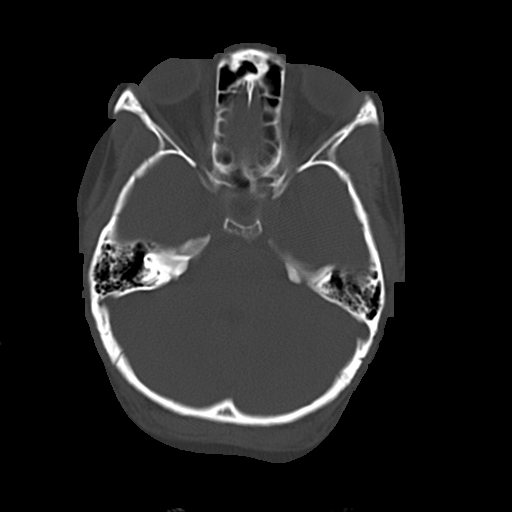
[im 11/29  bone]
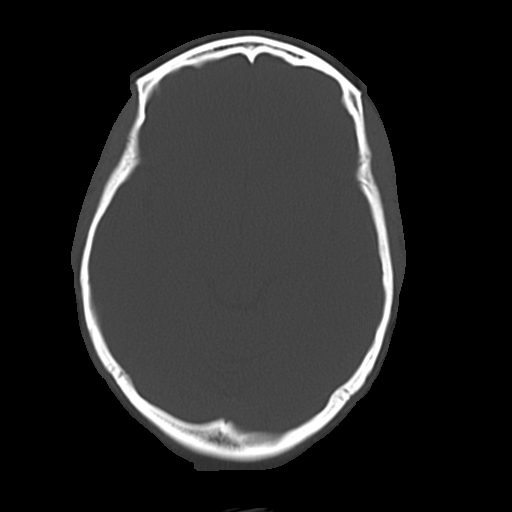

[16 of 30 positions shown; findings below may reference images not displayed]

IMPRESSION: No acute abnormality.

## 2012-07-29 IMAGING — CR DG ABDOMEN 2V
1 series · 3 of 3 positions shown · non-contrast
Comparison: none

REASON FOR EXAM: /SBO
COMMENTS:

PROCEDURE:     DXR - DXR ABDOMEN 2 V FLAT AND ERECT  - September 21, 2011  [DATE]
RESULT:     The bowel gas pattern is within the limits of normal. There is a
tubular structure which may reflect a drainage catheter or biliary tube in
the upper quadrant adjacent to the gallbladder clips.

[Series 1: w abdomen upright · 0.14mm/px · 3 of 3 slices shown]
[im 1/3]
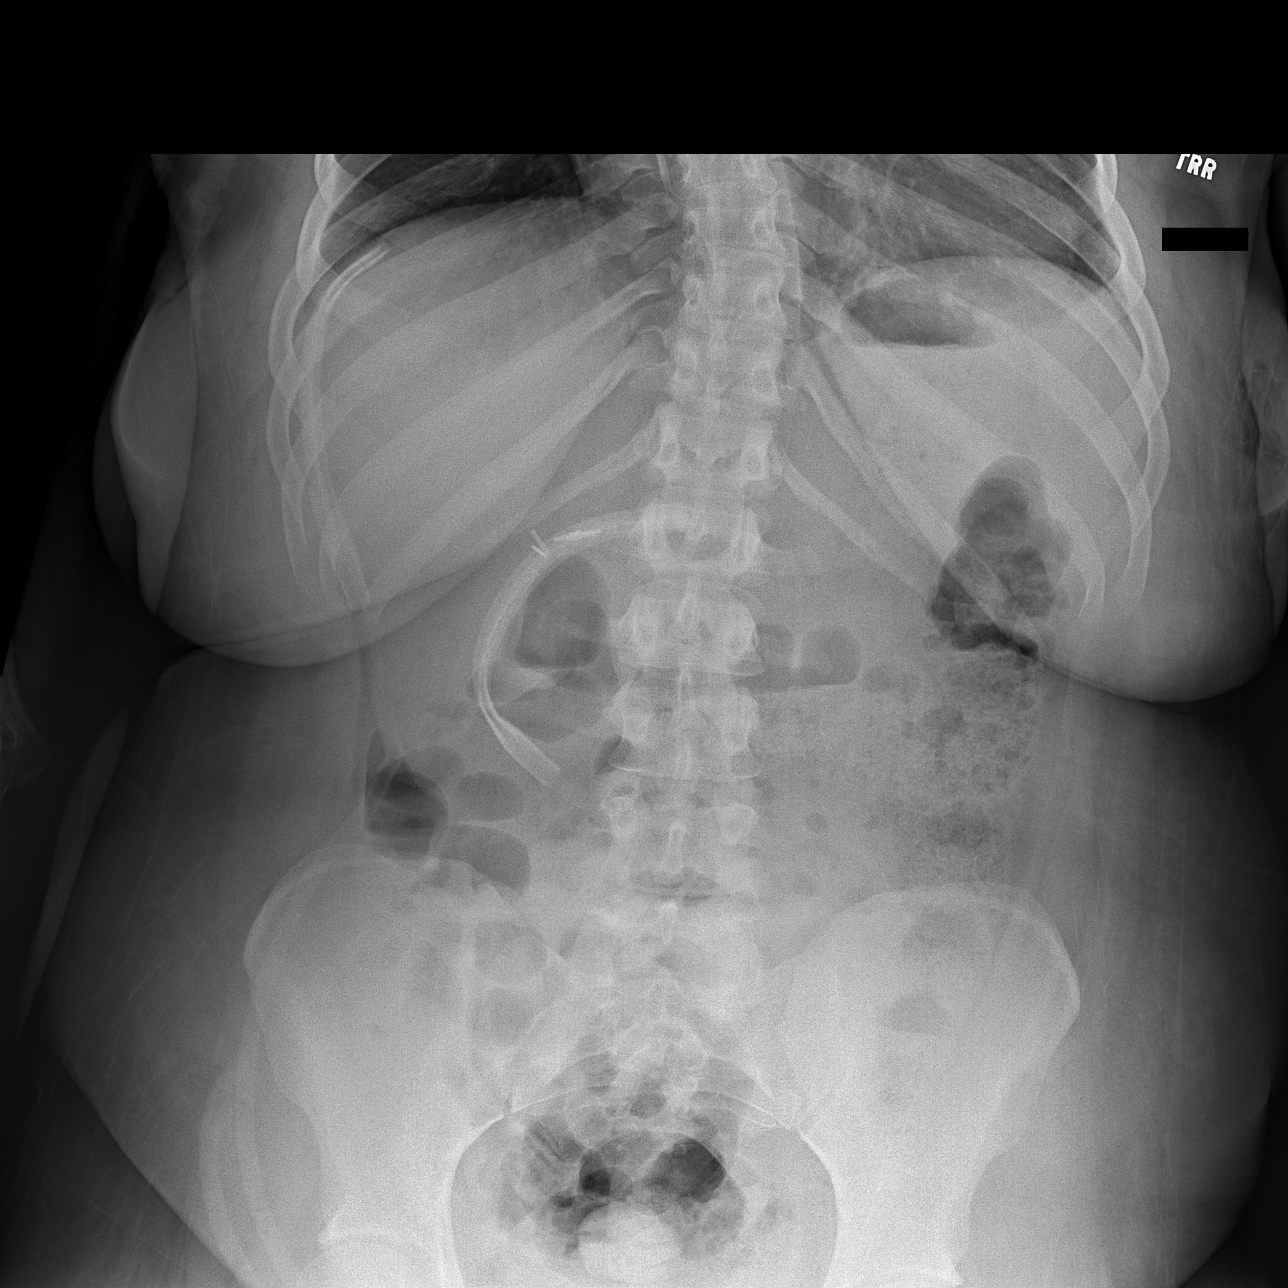
[im 2/3]
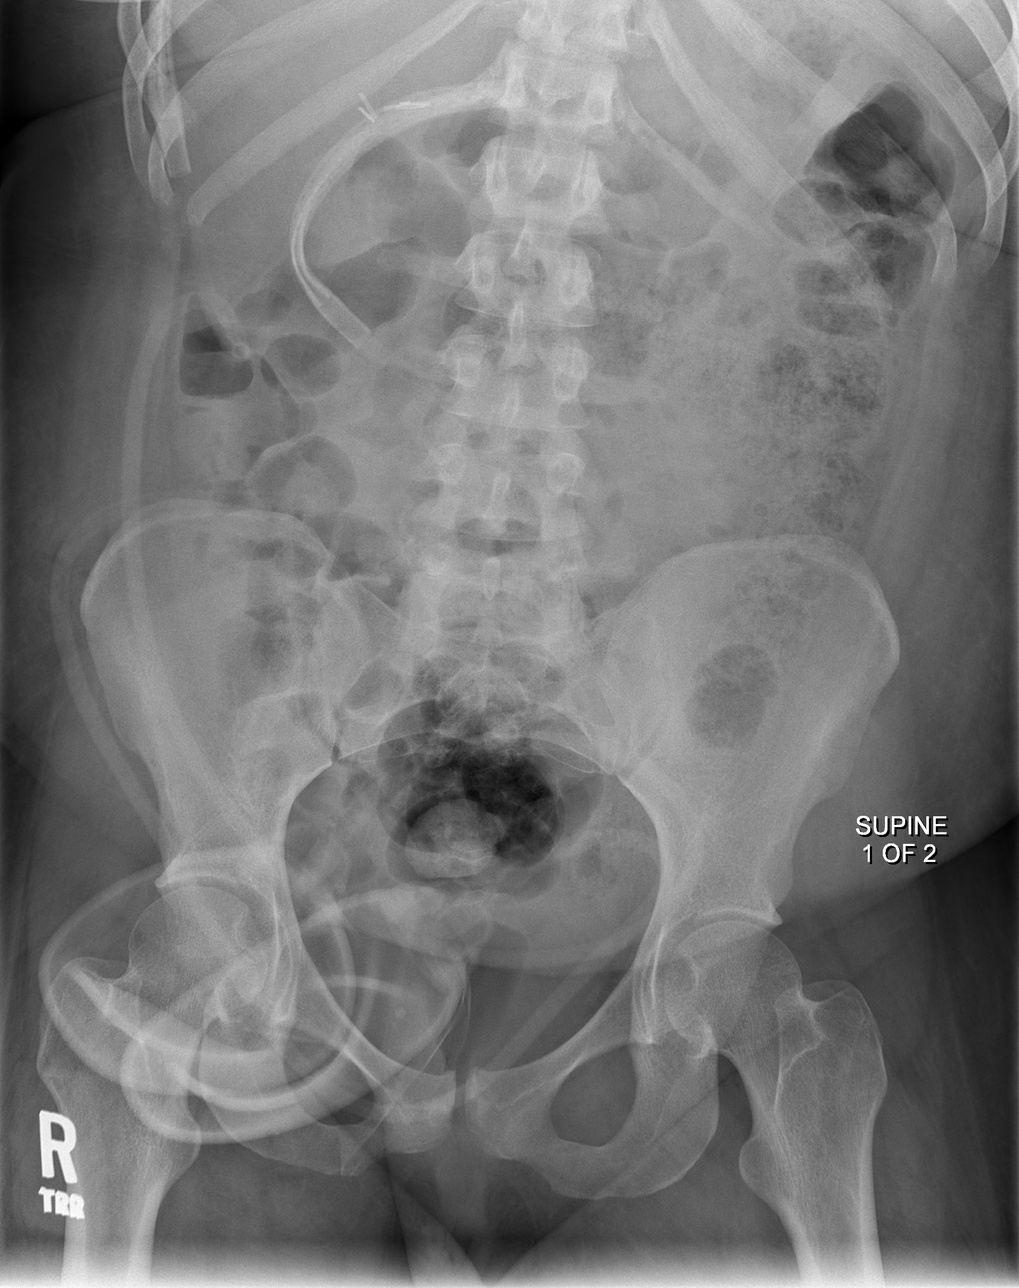
[im 3/3]
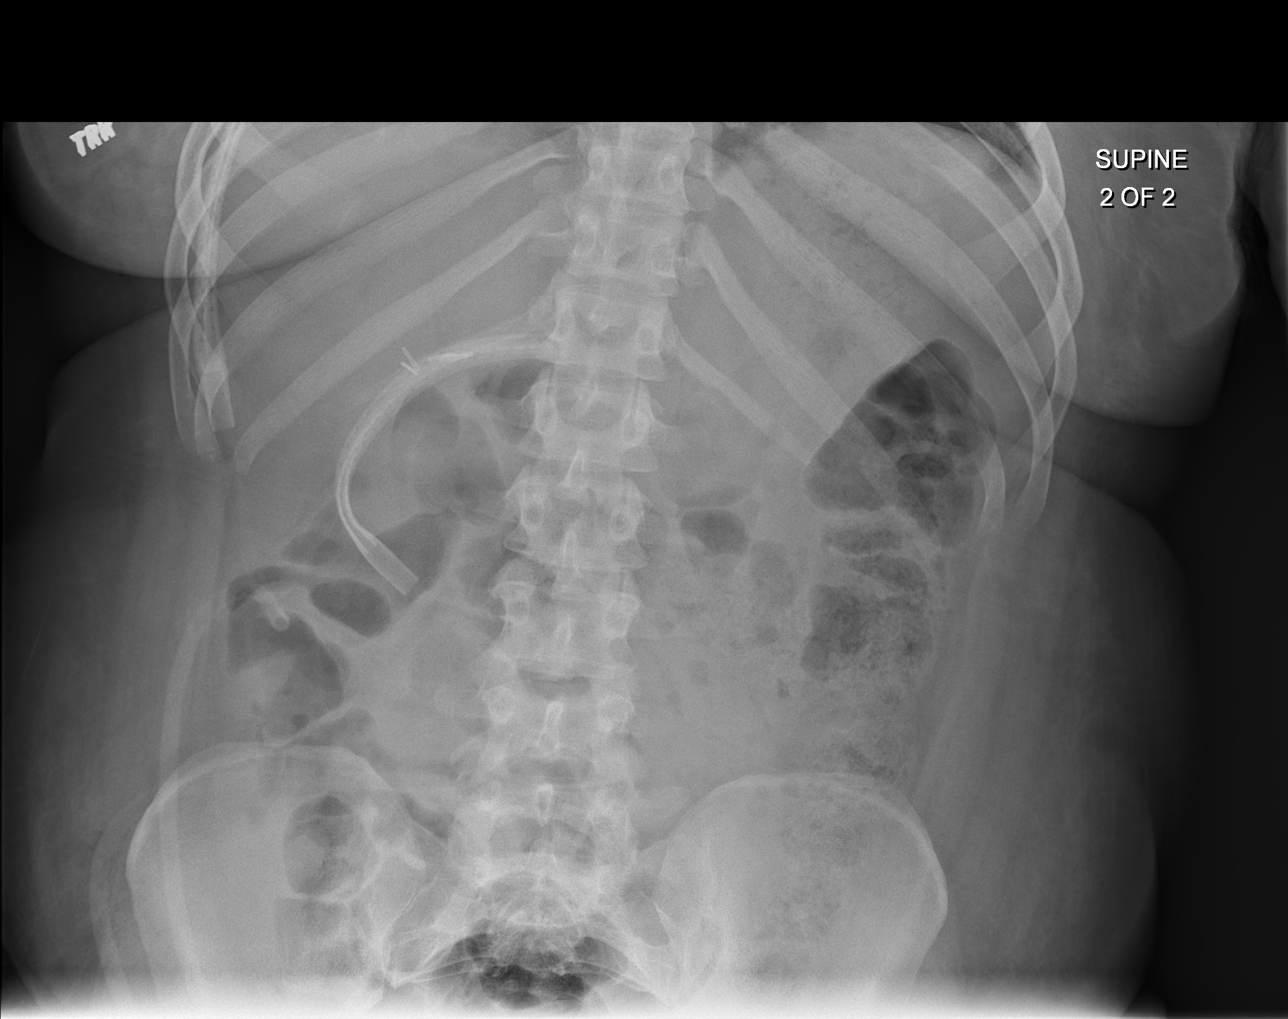

[3 of 3 positions shown; findings below may reference images not displayed]

IMPRESSION: There is no evidence of bowel obstruction or ileus. A
moderate amount of stool in the left colon may indicate constipation.

[REDACTED]

## 2013-03-25 ENCOUNTER — Emergency Department: Payer: Self-pay | Admitting: Internal Medicine

## 2013-03-25 LAB — DRUG SCREEN, URINE
AMPHETAMINES, UR SCREEN: NEGATIVE (ref ?–1000)
BENZODIAZEPINE, UR SCRN: NEGATIVE (ref ?–200)
Barbiturates, Ur Screen: NEGATIVE (ref ?–200)
CANNABINOID 50 NG, UR ~~LOC~~: POSITIVE (ref ?–50)
Cocaine Metabolite,Ur ~~LOC~~: NEGATIVE (ref ?–300)
MDMA (Ecstasy)Ur Screen: NEGATIVE (ref ?–500)
METHADONE, UR SCREEN: NEGATIVE (ref ?–300)
OPIATE, UR SCREEN: POSITIVE (ref ?–300)
Phencyclidine (PCP) Ur S: NEGATIVE (ref ?–25)
Tricyclic, Ur Screen: NEGATIVE (ref ?–1000)

## 2013-03-25 LAB — URINALYSIS, COMPLETE
BACTERIA: NONE SEEN
BILIRUBIN, UR: NEGATIVE
BLOOD: NEGATIVE
GLUCOSE, UR: NEGATIVE mg/dL (ref 0–75)
KETONE: NEGATIVE
LEUKOCYTE ESTERASE: NEGATIVE
NITRITE: NEGATIVE
PH: 6 (ref 4.5–8.0)
Protein: NEGATIVE
SPECIFIC GRAVITY: 1.024 (ref 1.003–1.030)
Squamous Epithelial: 13
WBC UR: 1 /HPF (ref 0–5)

## 2013-03-25 LAB — COMPREHENSIVE METABOLIC PANEL
ANION GAP: 6 — AB (ref 7–16)
Albumin: 3.9 g/dL (ref 3.4–5.0)
Alkaline Phosphatase: 60 U/L
BUN: 11 mg/dL (ref 7–18)
Bilirubin,Total: 0.4 mg/dL (ref 0.2–1.0)
Calcium, Total: 9.2 mg/dL (ref 8.5–10.1)
Chloride: 105 mmol/L (ref 98–107)
Co2: 26 mmol/L (ref 21–32)
Creatinine: 0.76 mg/dL (ref 0.60–1.30)
EGFR (African American): >60
Glucose: 84 mg/dL (ref 65–99)
OSMOLALITY: 272 (ref 275–301)
Potassium: 3.8 mmol/L (ref 3.5–5.1)
SGOT(AST): 20 U/L (ref 15–37)
SGPT (ALT): 17 U/L (ref 12–78)
Sodium: 137 mmol/L (ref 136–145)
Total Protein: 7.6 g/dL (ref 6.4–8.2)

## 2013-03-25 LAB — CBC
HCT: 38.8 % (ref 35.0–47.0)
HGB: 12.7 g/dL (ref 12.0–16.0)
MCH: 28.9 pg (ref 26.0–34.0)
MCHC: 32.8 g/dL (ref 32.0–36.0)
MCV: 88 fL (ref 80–100)
Platelet: 256 10*3/uL (ref 150–440)
RBC: 4.4 10*6/uL (ref 3.80–5.20)
RDW: 13.9 % (ref 11.5–14.5)
WBC: 5.3 10*3/uL (ref 3.6–11.0)

## 2013-03-25 LAB — CK TOTAL AND CKMB (NOT AT ARMC)
CK, Total: 100 U/L (ref 21–215)
CK-MB: <0.5 ng/mL — ABNORMAL LOW (ref 0.5–3.6)

## 2013-03-25 LAB — TROPONIN I

## 2013-09-06 ENCOUNTER — Emergency Department: Payer: Self-pay | Admitting: Emergency Medicine

## 2013-09-06 LAB — URINALYSIS, COMPLETE
Bacteria: NONE SEEN
Bilirubin,UR: NEGATIVE
Glucose,UR: NEGATIVE mg/dL (ref 0–75)
KETONE: NEGATIVE
Leukocyte Esterase: NEGATIVE
Nitrite: NEGATIVE
PH: 6 (ref 4.5–8.0)
PROTEIN: NEGATIVE
RBC,UR: 2 /HPF (ref 0–5)
SPECIFIC GRAVITY: 1.015 (ref 1.003–1.030)
Squamous Epithelial: 5
WBC UR: 2 /HPF (ref 0–5)

## 2013-09-06 LAB — COMPREHENSIVE METABOLIC PANEL
ALBUMIN: 4.2 g/dL (ref 3.4–5.0)
ALK PHOS: 64 U/L
ANION GAP: 9 (ref 7–16)
AST: 23 U/L (ref 15–37)
BUN: 11 mg/dL (ref 7–18)
Bilirubin,Total: 0.3 mg/dL (ref 0.2–1.0)
CALCIUM: 9.3 mg/dL (ref 8.5–10.1)
CO2: 24 mmol/L (ref 21–32)
CREATININE: 1.01 mg/dL (ref 0.60–1.30)
Chloride: 107 mmol/L (ref 98–107)
EGFR (Non-African Amer.): >60
GLUCOSE: 87 mg/dL (ref 65–99)
OSMOLALITY: 278 (ref 275–301)
Potassium: 3.7 mmol/L (ref 3.5–5.1)
SGPT (ALT): 14 U/L (ref 12–78)
SODIUM: 140 mmol/L (ref 136–145)
Total Protein: 8.4 g/dL — ABNORMAL HIGH (ref 6.4–8.2)

## 2013-09-06 LAB — DRUG SCREEN, URINE
Amphetamines, Ur Screen: NEGATIVE (ref ?–1000)
Barbiturates, Ur Screen: NEGATIVE (ref ?–200)
Benzodiazepine, Ur Scrn: NEGATIVE (ref ?–200)
Cannabinoid 50 Ng, Ur ~~LOC~~: POSITIVE (ref ?–50)
Cocaine Metabolite,Ur ~~LOC~~: NEGATIVE (ref ?–300)
MDMA (ECSTASY) UR SCREEN: NEGATIVE (ref ?–500)
METHADONE, UR SCREEN: NEGATIVE (ref ?–300)
Opiate, Ur Screen: NEGATIVE (ref ?–300)
Phencyclidine (PCP) Ur S: NEGATIVE (ref ?–25)
Tricyclic, Ur Screen: NEGATIVE (ref ?–1000)

## 2013-09-06 LAB — CBC
HCT: 40.2 % (ref 35.0–47.0)
HGB: 13.1 g/dL (ref 12.0–16.0)
MCH: 29.6 pg (ref 26.0–34.0)
MCHC: 32.7 g/dL (ref 32.0–36.0)
MCV: 91 fL (ref 80–100)
PLATELETS: 292 10*3/uL (ref 150–440)
RBC: 4.43 10*6/uL (ref 3.80–5.20)
RDW: 13.8 % (ref 11.5–14.5)
WBC: 7.1 10*3/uL (ref 3.6–11.0)

## 2013-09-06 LAB — SALICYLATE LEVEL
Salicylates, Serum: 10.3 mg/dL — ABNORMAL HIGH
Salicylates, Serum: 7.4 mg/dL — ABNORMAL HIGH

## 2013-09-06 LAB — ETHANOL: Ethanol %: <0.003 % (ref 0.000–0.080)

## 2013-09-06 LAB — ACETAMINOPHEN LEVEL: Acetaminophen: <2 ug/mL

## 2013-09-06 LAB — PREGNANCY, URINE: Pregnancy Test, Urine: NEGATIVE m[IU]/mL

## 2014-02-05 LAB — OB RESULTS CONSOLE RPR: RPR: NONREACTIVE

## 2014-02-05 LAB — OB RESULTS CONSOLE HIV ANTIBODY (ROUTINE TESTING): HIV: NONREACTIVE

## 2014-02-17 LAB — OB RESULTS CONSOLE GBS: STREP GROUP B AG: POSITIVE

## 2014-03-14 NOTE — L&D Delivery Note (Signed)
VAGINAL DELIVERY NOTE:  Date of Delivery: 09/03/2014 Primary OB: [facility]  Gestational Age/EDD: [redacted]w[redacted]d 09/30/2014, by Ultrasound Antepartum complications: pregnancy-induced hypertension Attending Physician: Annamarie Major, MD, FACOG Delivery Type: repeat cesarean section, low transverse incision  Anesthesia: spinal Laceration: n/a Episiotomy: none Placenta: spontaneous Intrapartum complications: None Estimated Blood Loss: 500 mL GBS: Unk Procedure Details: CS for preclampsia and IUGR and Oligohydramnios  Baby: Liveborn female, Apgars 8/9, weight 4-11 lbs

## 2014-05-30 ENCOUNTER — Emergency Department: Payer: Self-pay | Admitting: Emergency Medicine

## 2014-07-01 NOTE — Discharge Summary (Signed)
[name] NAME:  [name], [name] MR#:  [id number] DATE OF BIRTH:  May 15, 1990  DATE OF ADMISSION:  09/17/2011 DATE OF DISCHARGE:  09/23/2011  BRIEF HISTORY: [name] is a 24 year old woman admitted in the early morning hours of July 6th with symptomatic biliary tract disease and what appeared to be a common bile duct obstruction. [name] had been sick with intermittent discomfort for over a year and then symptoms almost constantly for the last two weeks prior to admission. Bilirubin at time of admission was 4.4 with an alkaline phosphatase of 451, elevated SGPT at 547, SGOT at 277. Work-up suggested gallbladder disease with evidence of possible common bile duct obstruction. Dr. Lutricia Feil of the Gastroenterology service saw [name] the next morning. He felt that [name] would benefit from ERCP to evaluate [name] common bile duct. [name] had already eaten that morning and so this procedure was scheduled for the morning of the 7th. [name] was taken to the ERCP Lab early in the morning of the 7th where [name] underwent an uncomplicated procedure. The procedure was performed under general anesthesia. There was no evidence of any significant bile duct obstruction and there did not appear to be a stone in the common bile duct. There did not appear to be any evidence of ductal disease. Because of the minimal nature of the procedure, [name] was taken to surgery the early afternoon of the 7th where [name] underwent a laparoscopic cholecystectomy. The procedure was uncomplicated intraoperatively. [name] did have evidence of acute cholecystitis. The gallbladder was markedly edematous and certainly discolored. No significant abnormalities were encountered in the bile duct. The cystic duct was large due to probable previous bile duct obstruction and so the cystic duct was divided with a stapler rather than with clips. Otherwise, the procedure was uncomplicated. [name] was returned to the floor without any problems. [name] continued to have mild hypertension in the  recovery room and then suffered significant hypotension at approximately 5 o'clock in the afternoon two hours following surgery. At that time we thought that [name] had narcotic medication issues since [name] had received significant amounts of medication in the recovery room. [name] was treated with Narcan and improved dramatically. However, approximately two hours later at 6:30 [name] developed another similar episode of hypotension, hypoxia, depressed oxygen saturation, and mild tachycardia. Hemoglobin was performed which revealed [name] preoperative hemoglobin of essentially 10 had dropped to a postoperative hemoglobin of 5. The possibility of postoperative bleeding was entertained and the [name] was taken urgently to surgery that evening. Surgery was performed at approximately 10 o'clock on the evening of July 7th by Dr. Dionne Milo and myself. Laparoscopy was repeated and a bleeding vessel found in the bed of the gallbladder. It was occluded. The abdomen was evacuated of significant amount of postoperative blood. Drains were placed. [name] continued to improve hemodynamically. [name] did require transfusion of 4 units of blood postsurgery. [name] had some issues with pain control and with hospital staff, primarily the physicians. [name] was seen by Behavioral Medicine at [name] request on July 11th. Family requested transfer to [facility] for further care but the [facility] transfer service when consulted did not accept the [name]. [name] continued to slowly improve and was discharged home on the 12th to be followed in the office in 7 to 10 days' time. Bathing, activity, and driving instructions were given to the [name].   DISCHARGE MEDICATIONS:  1. Percocet 5/325 1 to 2 tablets every 4 to 6 hours p.r.n. 2. Sertraline 50 mg once a day. 3. Trazodone  75 mg p.o. at bedtime.   FINAL DISCHARGE DIAGNOSES:  1. Acute cholecystitis. 2. Acute common bile duct obstruction.  3. Postoperative hemorrhage. 4. Personality disorder, nonspecific.    SURGERY:  1. ERCP. 2. Laparoscopic cholecystectomy.  3. Re-exploration for hemorrhage.   ____________________________ Carmie Endalph L. Ely III, MD rle:drc D: 09/30/2011 20:42:33 ET T: 10/03/2011 10:05:01 ET JOB#: [id number]  cc: Quentin Orealph L. Ely III, MD, <Dictator> Aarti K. Maryruth BunKapur, MD Ezzard StandingPaul Y. Bluford Kaufmannh, MD Quentin OreALPH L ELY MD ELECTRONICALLY SIGNED 10/04/2011 22:42
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
## 2014-07-05 NOTE — Consult Note (Signed)
PATIENT NAME:  [name], Shaylie C MR#:  [id number] DATE OF BIRTH:  September 02, 1990  DATE OF CONSULTATION:  09/06/2013  REFERRING PHYSICIAN:   CONSULTING PHYSICIAN:  Audery AmelJohn T. Clapacs, MD  IDENTIFYING INFORMATION AND REASON FOR CONSULT: A 24 year old woman with a past history of depression who was brought here to the hospital because of a possible overdose.   CHIEF COMPLAINT: "I'm just trying to make my mom upset."   HISTORY OF PRESENT ILLNESS: Information obtained from the patient and the chart. The history she is giving  seems to be pretty consistent throughout her time here in the Emergency Room. She says that yesterday she had gotten into an argument with a neighbor woman over some issues involving her children. This escalated to where the patient was verbally losing her temper. The patient had been staying with her mother recently, so naturally her mother got involved in all of the drama. The patient then turned her anger on her mother. She says that when she to home from work, she took about 5 Tylenol PM tablets. The patient totally denies that it was 20 of them. She admits; however, that 5 is more than her usual dosage. She called her mother to tell her that she had taken an excessive amount of medicine by her report just because she wanted some attention and wanted to make her mother upset. The patient completely denies any suicidal intent or plan, and denies any psychotic symptoms. Admits that her mood stays down at times and she can have mood swings. She stays tired much of the time, but she does work long shifts. She denies suicidal ideation. Denies psychotic symptoms. She is not currently getting outpatient mental health treatment.   PAST PSYCHIATRIC HISTORY: She has had past hospital stays for depression and was treated with antidepressants, but has not been compliant with them. She says that she would like to go back and get mental health treatment again.  She suspects that the antidepressants would  have been helpful if she had stayed on them more consistently. Denies that she has a past history of real attempts to harm herself.   SUBSTANCE ABUSE HISTORY: Admits that she smokes marijuana on a fairly frequent basis. Does not seem to see it as being that big a problem. Denies other current drug use.   SOCIAL HISTORY: The patient has 2 young children, ages 746 and 663. She has recently been living with her mother, but is trying to move out into a new place. She has been working long shifts at [employer] facility. Feels like she has a lot of stress in her life, which she probably does.   PAST MEDICAL HISTORY: No significant ongoing medical problems.   CURRENT MEDICATIONS: None.   ALLERGIES: NO KNOWN DRUG ALLERGIES.   REVIEW OF SYSTEMS: Denies depression. Denies suicidal or homicidal ideation. Denies hallucinations. Currently not reporting any physical symptoms. Generally, negative review of systems.   MENTAL STATUS EXAMINATION: Neatly groomed woman, looks her stated age, cooperative with the interview. Eye contact good. Psychomotor activity normal. She was sleepy when I first woke her up, but as she became more awake, her activity was much more normal. Speech normal rate, tone, and volume. Affect is euthymic, reactive, appropriate. No sign of depression. Not agitated or angry. Mood stated as fine. Thoughts are lucid. No evidence of delusional thinking or loosening of associations. Denies auditory or visual hallucinations. Denies any suicidal or homicidal ideation. Says that at times she gets a little bit down and depressed  but is not feeling too bad right now. Alert and oriented x 4. Short-term and long-term memory intact. Normal intelligence.   LABORATORY RESULTS: Drug screen positive for cannabis. Pregnancy test negative. Salicylates slightly elevated at 7.4. Urinalysis fairly unremarkable. The CBC is normal. Alcohol negative. Chemistry panel is all unremarkable. Acetaminophen level is negative.    ASSESSMENT: This is a 24 year old woman who took about 5 pills of Tylenol PM. Actually, I would have expected she would have had an acetaminophen level for that and she does not. The salicylate level is not high and certainly is not in toxic level. In any case, I tend to believe her that she is not actually trying to harm herself. She does not appear to be acutely severely depressed. She is forward looking, very committed to her children, and has multiple positive plans for the future. At this point, I do not think she needs hospital level treatment. She is agreeable to outpatient treatment and will be referred back to go to [facility] or one of other local providers.   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   SECONDARY DIAGNOSES:  AXIS I: Depression, not otherwise specified, by history.  AXIS II: Deferred.  AXIS III: No diagnosis.  AXIS IV: Moderate to severe from being a single mother, working hard, and limited support.  AXIS V: Functioning at time of evaluation 54.    ____________________________ Audery Amel, MD jtc:ts D: 09/06/2013 16:28:01 ET T: 09/06/2013 17:05:02 ET JOB#: [id number]  cc: Audery Amel, MD, <Dictator> Audery Amel MD ELECTRONICALLY SIGNED 09/07/2013 1:49

## 2014-07-06 NOTE — H&P (Signed)
PATIENT NAME:  [name], Carlia C MR#:  [id number] DATE OF BIRTH:  Jun 18, 1990  DATE OF ADMISSION:  09/17/2011  CHIEF COMPLAINT: Back pain.   HISTORY OF PRESENT ILLNESS: This is a patient with a 1-1/2-year history of abdominal pain in the right upper quadrant associated with fatty food intolerance that happens nearly every day. She has been to the Emergency Room multiple times, and this was occurring mostly when she was pregnant last year and earlier this year. She has had nausea and vomiting, has had some light-colored stools and very dark urine, but no fevers or chills. She states her pain is mostly in her back and right upper quadrant. The dark urine and back pain has been going on for about a week. She states she was to see Dr. Michela PitcherEly in the office last year but was waiting on her Medicaid to come through.   PAST MEDICAL HISTORY: Asthma.   PAST SURGICAL HISTORY: Cesarean section x2.   ALLERGIES: None.   MEDICATIONS: Spiriva and albuterol.   FAMILY HISTORY: Noncontributory.   SOCIAL HISTORY: She smokes tobacco on occasion. 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   REVIEW OF SYSTEMS: A 10-system review was performed and negative with the exception of that mentioned in the history of present illness.   PHYSICAL EXAMINATION:  GENERAL: A healthy black female patient, BMI of 28.   VITAL SIGNS: Temperature is 98.1, pulse 76, respirations 18, blood pressure 142/83, pain scale of 10, 100% room air saturations.   HEENT: Scleral icterus.   NECK: No palpable neck nodes.   CHEST: Clear to auscultation.   CARDIAC: Regular rate and rhythm.   ABDOMEN: Soft. There is tenderness in the right upper quadrant. There is a tattoo in the periumbilical area, a nondistended abdomen.   EXTREMITIES: Without edema. Calves are nontender.   NEUROLOGIC: Grossly intact.   INTEGUMENT: No jaundice. Tattoos are present.   LABORATORY, DIAGNOSTIC AND RADIOLOGICAL DATA:  Laboratory values demonstrate a  white blood cell count of 4.7, hemoglobin and hematocrit 10.8 and 33. Electrolytes are within normal limits.  Total bilirubin is 4.4, alkaline phosphatase 451, AST/ALT 277 and 47, respectively, and a total protein of 8.6.  Multiple ultrasounds in the past have shown gallstones. The ultrasound that was relayed to me but is not available at this time by Dr. Jens SomWiegand in the ED, who I spoke to personally, suggested that the common bile duct was 1.4 cm dilated. Stones were present both in the gallbladder and in the common bile duct.   ASSESSMENT: Choledocholithiasis.  PLAN: Recommend admit to the hospital, hydrate, control nausea and pain. Start IV antibiotics and ask for GI consultation concerning probable choledocholithiasis. The rationale for this approach has been discussed with the patient. She was in agreement with the plan.  ____________________________ Adah Salvageichard E. Excell Seltzerooper, MD rec:cbb D: 09/17/2011 03:44:55 ET  T: 09/17/2011 11:52:49 ET JOB#: [id number] Lattie HawICHARD E Jamila Slatten MD ELECTRONICALLY SIGNED 09/17/2011 23:45

## 2014-07-06 NOTE — Op Note (Signed)
PATIENT NAME:  [name], [name] MR#:  [id number] DATE OF BIRTH:  1990/07/11  DATE OF PROCEDURE:  09/18/2011  PREOPERATIVE DIAGNOSIS: Hemoperitoneum.   POSTOPERATIVE DIAGNOSIS: Hemoperitoneum.   PROCEDURE: Diagnostic laparoscopy, laparoscopic evacuation of hemoperitoneum, and clipping of arterial structure in liver bed.   SURGEON: Dionne Milo, MD  ANESTHESIA: General with endotracheal tube.  ASSISTANT: Tiney Rouge, MD   INDICATIONS: This is a patient who is several hours status post laparoscopic cholecystectomy and an ERCP earlier today for choledocholithiasis and acute cholecystitis. In the postoperative period, she has had various bouts of dropping her blood pressure and only recently raising her heart rate. She was given IV fluids and in spite of that her blood pressure continued to fall. A unit of blood was ordered when the hemoglobin returned at 5; preoperatively it was 9.3. With this in mind and her continued drop in blood pressure, she was taken to the operating room emergently for presumed hemoperitoneum and shock.   Informed consent was obtained from the significant other. I discussed with him the rationale for emergency operation and the risks of ongoing bleeding and resuscitation efforts and the risks of not operating. Consent could not be obtained from the patient as she was hypotensive at the time.   FINDINGS: Large hemoperitoneum. Approximately 2 liters of clotted and unclotted blood was evacuated initially before any irrigation was utilized. There was no active bleeding at the initial inspection of the liver bed. There was a clip on a pulsatile cystic artery. Dr. Michela Pitcher was present and pointed out the anatomy that he had seen earlier in the day. He had placed a single clip on the cystic artery and two other clips above that on a small structure that he thought was potentially bleeding and he had used the stapler across the cystic artery. Initially irrigation and aspiration failed to  identify a bleeding source. The camera was placed in multiple port sites to view around and no other source of bleeding was identified, certainly no port site bleeding was identified. A small arterial bleeder was ultimately identified in the liver bed. There was cautery artifact and once that cautery had been disrupted it bled with bright red arterial bleeding. This was doubly clipped.   DESCRIPTION OF PROCEDURE: The patient was induced to general anesthesia. A central line was placed in the right internal jugular catheter with the [place] by Dr. Michela Pitcher. See separate dictation.   She was then prepped and draped in a sterile fashion. A Foley catheter had been placed on the floor. Sutures were removed and IV antibiotics were given.  Through the infraumbilical port was placed a 5 mm trocar with elevation of the abdominal wall. A pneumoperitoneum was obtained. The camera was placed and utilizing a 5 mm, 30 degree scope hemoperitoneum was identified immediately. Therefore, under direct vision, a 10 mm epigastric port was placed through the same port site and then aspiration utilizing a 10 mm suction catheter was performed removing approximately 2 liters of blood from the right upper quadrant and pelvis and right lateral paracolic gutter. This allowed for good visualization and further pneumoperitoneum attainment. The two additional 5 mm ports were placed through the previous incisions. This allowed for inspection by utilizing retraction and irrigation and aspiration. The liver bed was inspected thoroughly. The clips were identified and discussion concerning the anatomy was held. Again no bleeding was identified in this area. The camera was placed in multiple port sites looking around at the other port sites to look for possible  bleeding sites and none was found. There was no sign of bleeding from adhesions in the infraumbilical area and there was no sign of bowel injury or bile leak.   Further inspection of the  liver bed with some manipulation of the liver bed identified a small structure that had some cautery artifact on the end of it (the liver parenchyma appeared normal, in both the right and left arterial distributions). This cautery artifact was disrupted which led to fairly brisk arterial bleeding. It was nonpulsatile in this small artery, but was clearly the site of bleeding. This was grasped and elevated and doubly clipped.   Further irrigation and inspection was performed. Attempts at removing all of the free blood and clot was performed in all quadrants. Reinspection multiple times was performed and then two separate JP drains were brought in through the lateral port sites. One was placed at the foramen of [place]low and the other was placed over the dome of the liver, on the lateral side. These were held in with 3-0 nylon. Further inspection again failed to identify any additional bleeding, no further source of bleeding was identified, no bile leak or bowel injury. Again, looking back at the periumbilical site as well, there was no sign of bowel injury. Therefore, ultimately after assuring that there was no further bleeding and irrigation and aspiration had been performed, the ports were removed after releasing pneumoperitoneum. The 10 mm port site, at the epigastrium, was closed with figure-of-eight 0 Vicryls and 4-0 subcuticular Monocryl was used at the epigastric and periumbilical site. The drains laterally were placed to drain JP grenade suction and Steri-Strips and Mastisol were placed on the two other incisions. Drain sponges were placed as were 2 x 2s and paper-tape.   The patient tolerated this procedure well. There were no further complications.   She was subjected to a chest x-ray because of preoperative attempts at placing a left subclavian vein. A central line was not successful in this hypotensive patient and for that reason a chest x-ray was obtained prior to leaving the recovery room. She was  then taken to the PACU in stable condition. Sponge, lap, and needle counts were correct. ____________________________ Adah Salvageichard E. Excell Seltzerooper, MD rec:slb D: 09/18/2011 23:26:57 ET T: 09/19/2011 10:53:36 ET JOB#: [id number]  cc: Adah Salvageichard E. Excell Seltzerooper, MD, <Dictator> Lattie HawICHARD E Rohan Juenger MD ELECTRONICALLY SIGNED 09/22/2011 6:59

## 2014-07-06 NOTE — Op Note (Signed)
PATIENT NAME:  [name], [name] MR#:  [id number] DATE OF BIRTH:  1990/12/26  DATE OF PROCEDURE:  09/18/2011  PREOPERATIVE DIAGNOSES: Chronic cholecystitis, choledocholithiasis, possible acute cholecystitis.   POSTOPERATIVE DIAGNOSES: Chronic cholecystitis, choledocholithiasis, possible acute cholecystitis.  PROCEDURE: Laparoscopic cholecystectomy.   SURGEON: Quentin Orealph L. Ely III, MD  ANESTHESIA: General.   DESCRIPTION OF PROCEDURE: With the patient in supine position after induction of appropriate general anesthesia, the patient's abdomen was prepped with ChloraPrep and draped with sterile towels. The patient was placed in head down, feet up position. Small infraumbilical incision was made in the standard fashion, carried down bluntly through subcutaneous tissue. Veress needle was used to cannulate the peritoneal cavity. CO2 was insufflated to appropriate pressure measurements. When approximately 2.5 liters of CO2 were instilled, the Veress needle was withdrawn. 11 mm [employer] port inserted in the peritoneal cavity. Intraperitoneal position was confirmed and CO2 was reinsufflated. The patient was placed in head up, feet down position, rotated slightly to left side. A subxiphoid transverse incision was made and an 11 mm port inserted under direct vision. Two lateral ports 5 mm in size were inserted under direct vision. The gallbladder was grasped, retracted superiorly and laterally. There did appear to be some significant edema around the gallbladder and it was certainly discolored. Hepatoduodenal ligament was explored. Cystic duct and cystic artery were identified. Cystic duct appeared large, was dissected back to its junction with the common bile duct. I anticipate the duct was larger than normal because of the previous common duct obstruction. The cystic duct was large that I was concerned that a single clip with just cover the cystic duct and if there was a need for future instrumentation I was  concerned about the possibility of bile leak. For that reason the subxiphoid port was exchanged for a 12 port and the cystic duct was divided with the GIA stapling device carrying a blue load. The cystic artery was doubly clipped and divided. The gallbladder was then dissected free from its bed and delivered using hook and cautery apparatus. Once the gallbladder was free, the camera the switched to the subxiphoid port and gallbladder was brought out through the umbilical port using the Endo Catch apparatus. There was one large gallstone noted. Several small pieces of cholesterol gravel were also identified. The area was copiously suctioned and irrigated. The subxiphoid incision was closed with the needle passer and 0 Vicryl and the midline incision was closed with figure-of-eight sutures of 0 Vicryl. Prior to closure the camera moved to the upper port and the umbilical area examined. Did not appear to be any injury of bowel or vascular injury despite the number of midline adhesions. The area was copiously suctioned, irrigated and the abdomen desufflated. Skin incisions were closed with 5-0 nylon. The area was infiltrated with 0.25% Marcaine for postoperative pain control. Sterile dressings were applied. Patient returned to the recovery room having tolerated procedure well. Sponge, instrument, and needle counts were correct x2 in the Operating Room.    ____________________________ Carmie Endalph L. Ely III, MD rle:cms D: 09/18/2011 15:26:15 ET T: 09/19/2011 09:56:50 ET JOB#: [id number]  cc: Carmie Endalph L. Ely III, MD, <Dictator> Ezzard StandingPaul Y. Bluford Kaufmannh, MD  Quentin OreALPH L ELY MD ELECTRONICALLY SIGNED 09/20/2011 16:01

## 2014-07-06 NOTE — Consult Note (Signed)
Darkened urine clearing according to patient. ERCP showed a large stone/s in GB but CBD looked normal. Biliary sphincterotomy with balloon sweeps done. No stone came out, suggesting that CBD stones may have passed spontaneously since U/S. Pt can go to GB surgery any time. Will sign off. Thanks.  Electronic Signatures: Lutricia Feilh, Amyr Sluder (MD)  (Signed on 07-Jul-13 09:45)  Authored  Last Updated: 07-Jul-13 09:45 by Lutricia Feilh, Zed Wanninger (MD)

## 2014-07-06 NOTE — Consult Note (Signed)
PATIENT NAME:  [name], [name] MR#:  [id number] DATE OF BIRTH:  [birth year]  DATE OF CONSULTATION:  09/17/2011  REFERRING PHYSICIAN:   CONSULTING PHYSICIAN:  Ezzard StandingPaul Y. Bluford Kaufmannh, MD  REASON FOR REFERRAL: Choledocholithiasis.   HISTORY OF PRESENT ILLNESS: The patient is a 24 year old black female who has had recurrent right upper quadrant pain associated with fatty food intolerance at least since last April 2012 when she was pregnant. She required multiple visits to the Emergency Room. Finally, after she had her baby she was told that she needed to have gallbladder surgery. This was advised as far back as last July. The patient did not follow up with surgery because she did not have any medical insurance.   Over the last two weeks she started having nausea and vomiting with some light-colored stool and dark urine, but no fevers or chills. The patient had increasing back pain and right upper quadrant pain. As a result, the patient came to the Emergency Room for further evaluation. The patient was found to have gallstones in the gallbladder as well as common bile duct with elevated bilirubin. Surgery was consulted and admitted the patient. I was asked to see the patient for possible ERCP.   REVIEW OF SYSTEMS: Again, there are no fevers or chills. There is no weight gain or weight loss. There is no chest pain or palpitations. There is no coughing or shortness of breath. GI symptoms have been described already with nausea, vomiting, abdominal pain, and light-colored stool. There is no gross hematochezia or melena.   OTHER PAST MEDICAL HISTORY:  History of asthma.   PAST SURGICAL HISTORY: Rodriguez-section.   ALLERGIES: She has no known drug allergies.   MEDICATIONS: She takes some inhalers for asthma.   FAMILY HISTORY: Negative.   SOCIAL HISTORY: She smokes about four cigarettes daily. She does not drink any alcohol.   PHYSICAL EXAMINATION:  GENERAL: The patient is in no acute distress.   VITAL SIGNS: She is  afebrile. Vital signs were stable.   HEENT: Normocephalic, atraumatic head. Sclerae icteric. Throat was clear.   NECK: Supple.   CARDIAC: Regular rhythm and rate without murmurs.   LUNGS: Clear bilaterally.   ABDOMEN: Normoactive bowel sounds, soft. There is tenderness throughout the right upper quadrant area. There is no hepatomegaly. She had active bowel sounds.   EXTREMITIES: No clubbing, cyanosis, or edema.   NEUROLOGIC: Grossly intact.   SKIN: Examination showed tattoo.   LABORATORY, DIAGNOSTIC, AND RADIOLOGICAL DATA: Sodium 137, potassium 3.8, chloride 106, CO2 26, BUN 12, creatinine 0.86, glucose 99. Lipase is normal at 128, bilirubin 4.4, alkaline phosphatase 451, AST 277, ALT 547, hemoglobin 10.8, white count is 4.7. On the ultrasound there is intrahepatic and extrahepatic biliary ductal dilation. The common bile duct is dilated at 14.3 mm. There appears to be some stones in the distal common bile duct with the largest calculus measuring 5 mm. There is a large stone or stones within the gallbladder.   ASSESSMENT AND PLAN: This is a patient with known gallstones who has evidence of choledocholithiasis at least on ultrasound and liver enzyme elevation. I discussed the ERCP in detail with the patient. I also discussed potential risks including pancreatitis, bleeding, perforation, and reaction to medication. I also discussed the possibility of failure to cannulate the common bile duct. If that is the case the patient may need open cholecystectomy with bile duct exploration later. We will try to arrange an ERCP for her first thing in the morning since the operating room  is busy today.   The patient will be given a full liquids tonight, n.p.o. after midnight, since the patient is hungry. Thank you for the referral.     ____________________________ Ezzard Standing. Bluford Kaufmann, MD pyo:bjt D: 09/18/2011 09:55:33 ET T: 09/18/2011 13:01:08 ET JOB#: [id number]  cc: Ezzard Standing. Bluford Kaufmann, MD, <Dictator> Ezzard Standing Emily Kuba  MD ELECTRONICALLY SIGNED 09/19/2011 10:40
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
## 2014-07-06 NOTE — Consult Note (Signed)
Full consult to follow. Known hx of gallstones since last Apr during her pregnancy. Recommended to have GB surgery last year but did not follow through due to lack of insurance. Presents with increasing abd pain, nausea, vomiting, darkened urine, and jaundice. U/S show gallstones within GB and CBD. CBD dilated. Hungry. Abd mildly tender. No fever. OR busy today. Plan ERCP in AM tomorrow. Continue Abx. NPO after MN. Discussed ERCP in detail as well as potential risks, incl failure to cannulate, pancreatitis, etc. Will follow. Thanks.  Electronic Signatures: Lutricia Feilh, Kanye Depree (MD)  (Signed on 06-Jul-13 10:34)  Authored  Last Updated: 06-Jul-13 10:34 by Lutricia Feilh, Sheva Mcdougle (MD)

## 2014-07-06 NOTE — H&P (Signed)
Subjective/Chief Complaint back pain    History of Present Illness over one year rec RUQ pain now with back pain for two weeks, dark urine no f/c. FFI, light stools    Past History PMH asthma PSH c section x2   Past Med/Surgical Hx:  Gallstones:   Herpes:   Preeclampsia:   Asthma:   migraines:   htn:   Bronchitis:   ceasarian:   ALLERGIES:  No Known Allergies:   Family and Social History:   Family History Non-Contributory    Social History positive  tobacco, negative ETOH, Hotel manager    + Tobacco Current (within 1 year)    Place of Living Home   Review of Systems:   Fever/Chills No    Cough No    Abdominal Pain Yes    Diarrhea No    Constipation No    Nausea/Vomiting Yes    SOB/DOE No    Chest Pain No    Dysuria No    Tolerating Diet Yes  Nauseated   Physical Exam:   GEN no acute distress    HEENT icterus    NECK supple    RESP normal resp effort  clear BS  no use of accessory muscles    CARD regular rate    ABD positive tenderness  soft  tattoo    LYMPH negative neck    SKIN normal to palpation, skin turgor good, tattoos    PSYCH alert, A+O to time, place, person, poor insight   Lab Results: Hepatic:  05-Jul-13 22:48    Bilirubin, Total  4.4   Alkaline Phosphatase  451   SGPT (ALT)  547 (12-78 NOTE: NEW REFERENCE RANGE 02/04/2011)   SGOT (AST)  277   Total Protein, Serum  8.6   Albumin, Serum 4.1  Routine Chem:  05-Jul-13 22:48    Glucose, Serum 99   BUN 12   Creatinine (comp) 0.86   Sodium, Serum 137   Potassium, Serum 3.8   Chloride, Serum 106   CO2, Serum 26   Calcium (Total), Serum 9.6   Osmolality (calc) 274   eGFR (African American) >60   eGFR (Non-African American) >60 (eGFR values <41m/min/1.73 m2 may be an indication of chronic kidney disease (CKD). Calculated eGFR is useful in patients with stable renal function. The eGFR calculation will not be reliable in acutely ill patients when serum creatinine is  changing rapidly. It is not useful in  patients on dialysis. The eGFR calculation may not be applicable to patients at the low and high extremes of body sizes, pregnant women, and vegetarians.)   Anion Gap  5   Lipase 128 (Result(s) reported on 16 Sep 2011 at 11:37PM.)  Routine Hem:  05-Jul-13 22:48    WBC (CBC) 4.7   RBC (CBC) 4.02   Hemoglobin (CBC)  10.8   Hematocrit (CBC)  33.0   Platelet Count (CBC) 284 (Result(s) reported on 16 Sep 2011 at 11:38PM.)   MCV 82   MCH 26.9   MCHC 32.7   RDW  16.6   Radiology Results: [place]    13-Jun-13 02:46, [place]   [place]   REASON FOR EXAM:    ruq pain nausea  COMMENTS:   Body Site: GB and Fossa, CBD, Head of Pancreas    PROCEDURE: [place] - [place] SURVEY  - Aug 25 2011  2:46AM     RESULT:     Findings: A shadowing, nonmobile 2 cm  gallstone is identified within the   neck of the gallbladder. There is no evidence of gallbladder distention   or pericholecystic fluid or sludging. There is no evidence of a   sonographic Murphy's sign. Gallbladder wall is not thickened measuring   2.1 mm. There is no evidence of intrahepatic or extrahepatic biliary   ductal dilatation. The common bile duct measures 3.0 mm in diameter. The   pancreas is not visualized. Hepatopetal flow is identified within the   portal vein.  IMPRESSION:    1. Nonmobile gallstone lodged withinthe neck of the gallbladder without   further sonographic evidence of cholecystitis. Clinical correlation   recommended.  2. Dr. Jasmine December of the Emergency Department was informed of these   findings via a preliminary faxed report.     Thank you for the opportunity to contribute to the care of your patient.           Verified By: Mikki Santee, M.D., MD     Assessment/Admission Diagnosis choledocholithiasis admit hydrate, control pain and nausea IV abx GI consult likely for poss ERCP then lap chole   Electronic  Signatures: Florene Glen (MD)  (Signed 06-Jul-13 03:40)  Authored: CHIEF COMPLAINT and HISTORY, PAST MEDICAL/SURGIAL HISTORY, ALLERGIES, FAMILY AND SOCIAL HISTORY, REVIEW OF SYSTEMS, PHYSICAL EXAM, LABS, Radiology, ASSESSMENT AND PLAN   Last Updated: 06-Jul-13 03:40 by Florene Glen (MD)

## 2014-07-14 ENCOUNTER — Observation Stay
Admission: EM | Admit: 2014-07-14 | Discharge: 2014-07-14 | Disposition: A | Discharge status: Home / Self Care | Payer: Medicaid Other | Attending: Obstetrics & Gynecology | Admitting: Obstetrics & Gynecology

## 2014-07-14 ENCOUNTER — Emergency Department: Payer: Medicaid Other

## 2014-07-14 DIAGNOSIS — O26893 Other specified pregnancy related conditions, third trimester: Secondary | ICD-10-CM | POA: Diagnosis not present

## 2014-07-14 DIAGNOSIS — R51 Headache: Secondary | ICD-10-CM | POA: Diagnosis present

## 2014-07-14 DIAGNOSIS — Z3A28 28 weeks gestation of pregnancy: Secondary | ICD-10-CM | POA: Insufficient documentation

## 2014-07-14 DIAGNOSIS — O1493 Unspecified pre-eclampsia, third trimester: Secondary | ICD-10-CM

## 2014-07-14 HISTORY — DX: Obesity, unspecified: E66.9

## 2014-07-14 HISTORY — DX: Supervision of pregnancy with other poor reproductive or obstetric history, unspecified trimester: O09.299

## 2014-07-14 HISTORY — DX: Bacteriuria: R82.71

## 2014-07-14 LAB — OB RESULTS CONSOLE ABO/RH: RH Type: POSITIVE

## 2014-07-14 LAB — OB RESULTS CONSOLE RUBELLA ANTIBODY, IGM: RUBELLA: NON-IMMUNE/NOT IMMUNE

## 2014-07-14 LAB — OB RESULTS CONSOLE VARICELLA ZOSTER ANTIBODY, IGG: VARICELLA IGG: IMMUNE

## 2014-07-14 LAB — GLUCOSE, CAPILLARY: GLUCOSE-CAPILLARY: 79 mg/dL (ref 70–99)

## 2014-07-14 MED ORDER — ACETAMINOPHEN 500 MG PO TABS
1000.0000 mg | ORAL_TABLET | Freq: Once | ORAL | Status: AC
  Administered 2014-07-14: 1000 mg via ORAL

## 2014-07-14 MED ORDER — LACTATED RINGERS IV SOLN
Freq: Once | INTRAVENOUS | Status: AC
  Administered 2014-07-14: 04:00:00 via INTRAVENOUS

## 2014-07-14 MED ORDER — ACETAMINOPHEN 500 MG PO TABS
ORAL_TABLET | ORAL | Status: AC
  Administered 2014-07-14: 1000 mg via ORAL
  Filled 2014-07-14: qty 2

## 2014-07-14 MED ORDER — LACTATED RINGERS IV SOLN
INTRAVENOUS | Status: DC
  Administered 2014-07-14: 06:00:00 via INTRAVENOUS
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
## 2014-07-14 NOTE — ED Provider Notes (Signed)
[facility] Emergency Department Provider Note    ____________________________________________  Time seen: Upon arrival to ED.  I have reviewed the triage vital signs and the nursing notes.   HISTORY  Chief Complaint Loss of Consciousness and Fall       HPI [name] is a 24 y.o. female approximately 7 months pregnant who arrives via EMS for ? syncopal episode prior to arrival. Patient husband states she was in the kitchen cooking when she fell. Upon his arrival patient was on the floor with eyes closed and generalized shaking. Patient is G3 P2 followed by [facility]. History of preeclampsia requiring hospitalization with last pregnancy. Patient complains of generalized 10/10 pain and headache.     No past medical history on file.  There are no active problems to display for this patient.   No past surgical history on file.  No current outpatient prescriptions on file.  Allergies Review of patient's allergies indicates not on file.  No family history on file.  Social History History  Substance Use Topics  . Smoking status: Not on file  . Smokeless tobacco: Not on file  . Alcohol Use: Not on file    Review of Systems  Constitutional: Negative for fever. Positive for generalized pain. Eyes: Negative for visual changes. ENT: Negative for sore throat. Cardiovascular: Negative for chest pain. Respiratory: Negative for shortness of breath. Gastrointestinal: Negative for abdominal pain, vomiting and diarrhea. Genitourinary: Negative for dysuria. Musculoskeletal: Negative for back pain. Skin: Negative for rash. Neurological: Positive for headache. Negative for focal weakness or numbness.   10-point ROS otherwise negative.  ____________________________________________   PHYSICAL EXAM:  VITAL SIGNS: ED Triage Vitals  Enc Vitals Group     BP 07/14/14 0102 145/102 mmHg     Pulse Rate 07/14/14 0102 93     Resp --      Temp --       Temp src --      SpO2 07/14/14 0102 100 %     Weight --      Height --      Head Cir --      Peak Flow --      Pain Score --      Pain Loc --      Pain Edu? --      Excl. in GC? --      Constitutional: Alert, appears dazed in mild distress. Shaking with eyes open and responding to provider. Eyes: Conjunctivae are normal. PERRL. Normal extraocular movements. ENT   Head: Normocephalic and atraumatic.   Nose: No congestion/rhinnorhea.   Mouth/Throat: Mucous membranes are moist. Did not bite tongue.   Neck: No stridor. Hematological/Lymphatic/Immunilogical: No cervical lymphadenopathy. Cardiovascular: Normal rate, regular rhythm. Normal and symmetric distal pulses are present in all extremities. No murmurs, rubs, or gallops. Respiratory: Normal respiratory effort without tachypnea nor retractions. Breath sounds are clear and equal bilaterally. No wheezes/rales/rhonchi. Gastrointestinal: Soft and nontender. No distention. No abdominal bruits. There is no CVA tenderness. Gravid abdomen. Genitourinary: No urinary incontinence noted. Musculoskeletal: Nontender with normal range of motion in all extremities. No joint effusions.  No lower extremity tenderness nor edema. Neurologic:  Alert, oriented 2. Slow to respond to commands but generally intact. CN II-12 within normal limits. Motor strength and sensation symmetrical in all limbs.  Skin:  Skin is warm, dry and intact. No rash noted. Psychiatric: Mood and affect are normal.  ____________________________________________   EKG  ED ECG REPORT   Date: 07/14/2014  EKG Time: 0113  Rate: 103  Rhythm: normal EKG, normal sinus rhythm, nonspecific ST and T waves changes, sinus tachycardia  Axis: Within normal limits  Intervals:none  ST&T Change: Nonspecific   ____________________________________________    RADIOLOGY  CT head per radiologist negative for intracranial  hemorrhage  ____________________________________________   PROCEDURES  Procedure(s) performed: None  Critical Care performed: No  ____________________________________________   INITIAL IMPRESSION / ASSESSMENT AND PLAN / ED COURSE  Pertinent labs & imaging results that were available during my care of the patient were reviewed by me and considered in my medical decision making (see chart for details).  24 year old female, approximately 7 months pregnant with prior history of preeclampsia arrives hypertensive status post syncope versus seizure. Presentation concerning for preeclampsia with seizure. Discussed at length with patient and husband risks and benefits of CT head. Both agree to proceed with CT head while shielding abdomen. If negative, patient will proceed to L and D for further evaluation of preeclampsia by OB.  ----------------------------------------- 2:15 AM on 07/14/2014 -----------------------------------------  CT head negative. Patient resting; will proceed directly to L&D.  ____________________________________________   FINAL CLINICAL IMPRESSION(S) / ED DIAGNOSES  Final diagnoses:  None     Irean HongJade J Sung, MD 07/14/14 [phone]

## 2014-07-14 NOTE — ED Notes (Signed)
Pt reports syncopal episode that took place approx. 1 hr. Pt unable to recall what happened. Pt c/o of generalized body pain. Pt is [redacted] weeks pregnant.

## 2014-08-25 ENCOUNTER — Encounter: Payer: Self-pay | Admitting: *Deleted

## 2014-08-25 ENCOUNTER — Inpatient Hospital Stay
Admission: EM | Admit: 2014-08-25 | Discharge: 2014-08-26 | DRG: 774 | Discharge status: Against Medical Advice | Payer: Medicaid Other | Attending: Obstetrics and Gynecology | Admitting: Obstetrics and Gynecology

## 2014-08-25 DIAGNOSIS — Z801 Family history of malignant neoplasm of trachea, bronchus and lung: Secondary | ICD-10-CM

## 2014-08-25 DIAGNOSIS — Z818 Family history of other mental and behavioral disorders: Secondary | ICD-10-CM

## 2014-08-25 DIAGNOSIS — Z79899 Other long term (current) drug therapy: Secondary | ICD-10-CM | POA: Diagnosis not present

## 2014-08-25 DIAGNOSIS — Z8249 Family history of ischemic heart disease and other diseases of the circulatory system: Secondary | ICD-10-CM | POA: Diagnosis not present

## 2014-08-25 DIAGNOSIS — E7439 Other disorders of intestinal carbohydrate absorption: Secondary | ICD-10-CM | POA: Diagnosis present

## 2014-08-25 DIAGNOSIS — F1210 Cannabis abuse, uncomplicated: Secondary | ICD-10-CM | POA: Diagnosis present

## 2014-08-25 DIAGNOSIS — O163 Unspecified maternal hypertension, third trimester: Secondary | ICD-10-CM | POA: Diagnosis present

## 2014-08-25 DIAGNOSIS — O1092 Unspecified pre-existing hypertension complicating childbirth: Secondary | ICD-10-CM | POA: Diagnosis present

## 2014-08-25 DIAGNOSIS — O99210 Obesity complicating pregnancy, unspecified trimester: Secondary | ICD-10-CM | POA: Diagnosis present

## 2014-08-25 DIAGNOSIS — Z3A37 37 weeks gestation of pregnancy: Secondary | ICD-10-CM | POA: Diagnosis present

## 2014-08-25 DIAGNOSIS — Z9049 Acquired absence of other specified parts of digestive tract: Secondary | ICD-10-CM | POA: Diagnosis present

## 2014-08-25 DIAGNOSIS — O99323 Drug use complicating pregnancy, third trimester: Secondary | ICD-10-CM | POA: Diagnosis present

## 2014-08-25 DIAGNOSIS — O99344 Other mental disorders complicating childbirth: Secondary | ICD-10-CM | POA: Diagnosis present

## 2014-08-25 DIAGNOSIS — O1400 Mild to moderate pre-eclampsia, unspecified trimester: Secondary | ICD-10-CM | POA: Diagnosis present

## 2014-08-25 DIAGNOSIS — Z87891 Personal history of nicotine dependence: Secondary | ICD-10-CM

## 2014-08-25 DIAGNOSIS — E669 Obesity, unspecified: Secondary | ICD-10-CM | POA: Diagnosis present

## 2014-08-25 DIAGNOSIS — O3421 Maternal care for scar from previous cesarean delivery: Secondary | ICD-10-CM | POA: Diagnosis present

## 2014-08-25 DIAGNOSIS — Z6835 Body mass index (BMI) 35.0-35.9, adult: Secondary | ICD-10-CM

## 2014-08-25 DIAGNOSIS — O1493 Unspecified pre-eclampsia, third trimester: Principal | ICD-10-CM | POA: Diagnosis present

## 2014-08-25 DIAGNOSIS — O0993 Supervision of high risk pregnancy, unspecified, third trimester: Secondary | ICD-10-CM

## 2014-08-25 HISTORY — DX: Major depressive disorder, single episode, unspecified: F32.9

## 2014-08-25 HISTORY — DX: Depression, unspecified: F32.A

## 2014-08-25 HISTORY — DX: Supervision of pregnancy with other poor reproductive or obstetric history, unspecified trimester: O09.299

## 2014-08-25 LAB — COMPREHENSIVE METABOLIC PANEL
ALBUMIN: 2.9 g/dL — AB (ref 3.5–5.0)
ALT: 11 U/L — ABNORMAL LOW (ref 14–54)
ANION GAP: 7 (ref 5–15)
AST: 15 U/L (ref 15–41)
Alkaline Phosphatase: 164 U/L — ABNORMAL HIGH (ref 38–126)
BUN: 12 mg/dL (ref 6–20)
CO2: 23 mmol/L (ref 22–32)
Calcium: 8.5 mg/dL — ABNORMAL LOW (ref 8.9–10.3)
Chloride: 106 mmol/L (ref 101–111)
Creatinine, Ser: 0.69 mg/dL (ref 0.44–1.00)
GFR calc Af Amer: >60 mL/min (ref >60)
GFR calc non Af Amer: >60 mL/min (ref >60)
Glucose, Bld: 74 mg/dL (ref 65–99)
Potassium: 3.9 mmol/L (ref 3.5–5.1)
Sodium: 136 mmol/L (ref 135–145)
Total Bilirubin: 0.4 mg/dL (ref 0.3–1.2)
Total Protein: 6.6 g/dL (ref 6.5–8.1)

## 2014-08-25 LAB — URINE DRUG SCREEN, QUALITATIVE (ARMC ONLY)
AMPHETAMINES, UR SCREEN: NOT DETECTED
BENZODIAZEPINE, UR SCRN: NOT DETECTED
Barbiturates, Ur Screen: NOT DETECTED
CANNABINOID 50 NG, UR ~~LOC~~: POSITIVE — AB
Cocaine Metabolite,Ur ~~LOC~~: NOT DETECTED
MDMA (Ecstasy)Ur Screen: NOT DETECTED
METHADONE SCREEN, URINE: NOT DETECTED
Opiate, Ur Screen: NOT DETECTED
PHENCYCLIDINE (PCP) UR S: NOT DETECTED
Tricyclic, Ur Screen: NOT DETECTED

## 2014-08-25 LAB — CBC
HCT: 29.8 % — ABNORMAL LOW (ref 35.0–47.0)
HEMOGLOBIN: 9.9 g/dL — AB (ref 12.0–16.0)
MCH: 29.1 pg (ref 26.0–34.0)
MCHC: 33.4 g/dL (ref 32.0–36.0)
MCV: 87.2 fL (ref 80.0–100.0)
Platelets: 204 10*3/uL (ref 150–440)
RBC: 3.41 MIL/uL — AB (ref 3.80–5.20)
RDW: 13.5 % (ref 11.5–14.5)
WBC: 10.6 10*3/uL (ref 3.6–11.0)

## 2014-08-25 LAB — ABO/RH: ABO/RH(D): O POS

## 2014-08-25 LAB — TYPE AND SCREEN
ABO/RH(D): O POS
Antibody Screen: NEGATIVE

## 2014-08-25 LAB — GLUCOSE, CAPILLARY
GLUCOSE-CAPILLARY: 74 mg/dL (ref 65–99)
Glucose-Capillary: 121 mg/dL — ABNORMAL HIGH (ref 65–99)

## 2014-08-25 LAB — PROTEIN / CREATININE RATIO, URINE
CREATININE, URINE: 211 mg/dL
PROTEIN CREATININE RATIO: 0.44 mg/mg{creat} — AB (ref 0.00–0.15)
TOTAL PROTEIN, URINE: 93 mg/dL

## 2014-08-25 MED ORDER — ACETAMINOPHEN 325 MG PO TABS: 650.0000 mg | ORAL_TABLET | ORAL | Status: DC | PRN

## 2014-08-25 MED ORDER — LACTATED RINGERS IV SOLN: 500.0000 mL | INTRAVENOUS | Status: DC | PRN

## 2014-08-25 MED ORDER — TRIAZOLAM 0.125 MG PO TABS
0.2500 mg | ORAL_TABLET | Freq: Every evening | ORAL | Status: DC | PRN
  Administered 2014-08-25: 0.25 mg via ORAL
  Filled 2014-08-25: qty 2

## 2014-08-25 MED ORDER — BETAMETHASONE SOD PHOS & ACET 6 (3-3) MG/ML IJ SUSP
12.0000 mg | Freq: Once | INTRAMUSCULAR | Status: AC
  Administered 2014-08-26: 12 mg via INTRAMUSCULAR
  Filled 2014-08-25: qty 2

## 2014-08-25 MED ORDER — BETAMETHASONE SOD PHOS & ACET 6 (3-3) MG/ML IJ SUSP
12.0000 mg | Freq: Once | INTRAMUSCULAR | Status: AC
  Administered 2014-08-25: 12 mg via INTRAMUSCULAR

## 2014-08-25 MED ORDER — BETAMETHASONE SOD PHOS & ACET 6 (3-3) MG/ML IJ SUSP
INTRAMUSCULAR | Status: AC
  Administered 2014-08-25: 12 mg via INTRAMUSCULAR
  Filled 2014-08-25: qty 1

## 2014-08-25 NOTE — OB Triage Note (Signed)
Patient in bed in fetal position with covers over head.  Avoids eye contact.  Minimal response to questions.  Denies any needs.

## 2014-08-25 NOTE — H&P (Signed)
OB History & Physical   History of Present Illness:  Chief Complaint: Presented from office with elevated blood pressures. C/O headache and blurred vision x 2 days.   HPI:  SHANTRELL PLACZEK is a 24 y.o. [phone] female at 53w6ddated by a 6wk1d ultrasound.  At her [facility] appt today BP was 140/100 and 151/102 and she has +2 proteinuria. She presented for evaluation of possible preeclampsia. Her pregnancy has been complicated by prior Cesarean section x2, severe preeclampsia with G1, FGR with G2, Psychiatric disorder (depression?bipolar), MJ use, and obesity. She had an early elevated one hr GTT and a normal 3hr, but was unable to tolerate her second 3hr GTT at 28 weeks. She had been checking some blood sugars (32-189), but has stopped checking them. and has not had dietary instruction.. Growth scan at 32 weeks was WNL.  She has had some irregular contractions.   She denies leakage of fluid and vaginal bleeding.   She reports normal fetal movement.   Prenatal care site: [facility]       Maternal Medical History:   Past Medical History  Diagnosis Date  . IUGR (intrauterine growth restriction) in prior pregnancy, pregnant   . GBS bacteriuria   . Obesity (BMI 35.0-39.9 without comorbidity)   . Hx of preeclampsia, prior pregnancy, currently pregnant   . Depression     Past Surgical History  Procedure Laterality Date  . Cesarean section    . Cholecystectomy, laparoscopic      No Known Allergies  Prior to Admission medications   Medication Sig Start Date End Date Taking? Authorizing Provider  acetaminophen (TYLENOL) 325 MG tablet Take 650 mg by mouth every 4 (four) hours as needed for mild pain.    Historical Provider, MD  Prenatal Vit-Fe Fumarate-FA (MULTIVITAMIN-PRENATAL) 27-0.8 MG TABS tablet Take 1 tablet by mouth daily at 12 noon.    Historical Provider, MD          Social History: She  reports that she has quit smoking. Her smoking use included Cigarettes. She has never used  smokeless tobacco. She reports that she uses illicit drugs (Marijuana). She reports that she does not drink alcohol.  Family History: family history includes Bipolar disorder in her mother; Heart failure in her maternal grandmother; Lung cancer in her paternal grandmother.   Review of Systems: Negative x 10 systems reviewed except as noted in the HPI.      Physical Exam:  Vital Signs: BP 136/88 mmHg  Pulse 82  Ht '5\' 2"'  (1.575 m)  Wt 102.059 kg (225 lb)  BMI 41.14 kg/m2  LMP  (LMP Unknown). Range: 128-136/70-88 General: crying when initially presented  HEENT: normocephalic, atraumatic Heart: regular rate & rhythm.  No murmurs/rubs/gallops Lungs: clear to auscultation bilaterally Abdomen: soft, gravid, non-tender;  EFW: 5 1/2# Pelvic:    Extremities: non-tender, symmetric,+2 pretibial edema bilaterally.  DTRs:+1 Neurologic: Alert & oriented x 3.    Pertinent Results:  Prenatal Labs: Blood type/Rh O positive  Antibody screen negative  Rubella nonimmune  RPR nonreactive  HBsAg negative  HIV nonreactive  GC negative  Chlamydia negative  Genetic screening Neg first trimester test  1 hour GTT 146  3 hour GTT WNL  GBS positive on urine   Baseline FHR: 135 beats/min    Variability: moderate     Accelerations: present to 150s to 160s    Decelerations: absent Contractions: irregular frequency: occasional Overall assessment: CAT1 tracing   Bedside Ultrasound:  Number of Fetus: single Presentation: cephalic  Fluid: 10.72cm  Placental Location: fundal Recent Results (from the past 2160 hour(s))  OB RESULTS CONSOLE Rubella Antibody     Status: None   Collection Time: 07/14/14 12:00 AM  Result Value Ref Range   Rubella Nonimmune   OB RESULTS CONSOLE Varicella zoster antibody, IgG     Status: None   Collection Time: 07/14/14 12:00 AM  Result Value Ref Range   Varicella Immune   OB RESULTS CONSOLE ABO/Rh     Status: None   Collection Time: 07/14/14 12:00 AM  Result Value Ref  Range   RH Type  Positive    ABO Grouping O   Glucose, capillary     Status: None   Collection Time: 07/14/14  1:13 AM  Result Value Ref Range   Glucose-Capillary 79 70 - 99 mg/dL  Glucose, capillary     Status: None   Collection Time: 08/25/14 11:36 AM  Result Value Ref Range   Glucose-Capillary 74 65 - 99 mg/dL  Protein / creatinine ratio, urine     Status: Abnormal   Collection Time: 08/25/14 12:03 PM  Result Value Ref Range   Creatinine, Urine 211 mg/dL   Total Protein, Urine 93 mg/dL    Comment: NO NORMAL RANGE ESTABLISHED FOR THIS TEST   Protein Creatinine Ratio 0.44 (H) 0.00 - 0.15 mg/mg[Cre]  Urine Drug Screen, Qualitative ([facility] only)     Status: Abnormal   Collection Time: 08/25/14 12:03 PM  Result Value Ref Range   Tricyclic, Ur Screen NONE DETECTED NONE DETECTED   Amphetamines, Ur Screen NONE DETECTED NONE DETECTED   MDMA (Ecstasy)Ur Screen NONE DETECTED NONE DETECTED   Cocaine Metabolite,Ur [place] NONE DETECTED NONE DETECTED   Opiate, Ur Screen NONE DETECTED NONE DETECTED   Phencyclidine (PCP) Ur S NONE DETECTED NONE DETECTED   Cannabinoid 50 Ng, Ur [place] POSITIVE (A) NONE DETECTED   Barbiturates, Ur Screen NONE DETECTED NONE DETECTED   Benzodiazepine, Ur Scrn NONE DETECTED NONE DETECTED   Methadone Scn, Ur NONE DETECTED NONE DETECTED    Comment: (NOTE) 242  Tricyclics, urine               Cutoff 1000 ng/mL 200  Amphetamines, urine             Cutoff 1000 ng/mL 300  MDMA (Ecstasy), urine           Cutoff 500 ng/mL 400  Cocaine Metabolite, urine       Cutoff 300 ng/mL 500  Opiate, urine                   Cutoff 300 ng/mL 600  Phencyclidine (PCP), urine      Cutoff 25 ng/mL 700  Cannabinoid, urine              Cutoff 50 ng/mL 800  Barbiturates, urine             Cutoff 200 ng/mL 900  Benzodiazepine, urine           Cutoff 200 ng/mL 1000 Methadone, urine                Cutoff 300 ng/mL 1100 1200 The urine drug screen provides only a preliminary, unconfirmed 1300  analytical test result and should not be used for non-medical 1400 purposes. Clinical consideration and professional judgment should 1500 be applied to any positive drug screen result due to possible 1600 interfering substances. A more specific alternate chemical method 1700 must be used in order to obtain a confirmed  analytical result.  1800 Gas chromato graphy / mass spectrometry (GC/MS) is the preferred 1900 confirmatory method.   CBC     Status: Abnormal   Collection Time: 08/25/14 12:08 PM  Result Value Ref Range   WBC 10.6 3.6 - 11.0 K/uL   RBC 3.41 (L) 3.80 - 5.20 MIL/uL   Hemoglobin 9.9 (L) 12.0 - 16.0 g/dL   HCT 29.8 (L) 35.0 - 47.0 %   MCV 87.2 80.0 - 100.0 fL   MCH 29.1 26.0 - 34.0 pg   MCHC 33.4 32.0 - 36.0 g/dL   RDW 13.5 11.5 - 14.5 %   Platelets 204 150 - 440 K/uL  Comprehensive metabolic panel     Status: Abnormal   Collection Time: 08/25/14 12:08 PM  Result Value Ref Range   Sodium 136 135 - 145 mmol/L   Potassium 3.9 3.5 - 5.1 mmol/L   Chloride 106 101 - 111 mmol/L   CO2 23 22 - 32 mmol/L   Glucose, Bld 74 65 - 99 mg/dL   BUN 12 6 - 20 mg/dL   Creatinine, Ser 0.69 0.44 - 1.00 mg/dL   Calcium 8.5 (L) 8.9 - 10.3 mg/dL   Total Protein 6.6 6.5 - 8.1 g/dL   Albumin 2.9 (L) 3.5 - 5.0 g/dL   AST 15 15 - 41 U/L   ALT 11 (L) 14 - 54 U/L   Alkaline Phosphatase 164 (H) 38 - 126 U/L   Total Bilirubin 0.4 0.3 - 1.2 mg/dL   GFR calc non Af Amer >60 >60 mL/min   GFR calc Af Amer >60 >60 mL/min    Comment: (NOTE) The eGFR has been calculated using the CKD EPI equation. This calculation has not been validated in all clinical situations. eGFR's persistently <60 mL/min signify possible Chronic Kidney Disease.    Anion gap 7 5 - 15  Type and screen     Status: None   Collection Time: 08/25/14 12:10 PM  Result Value Ref Range   ABO/RH(D) O POS    Antibody Screen NEG    Sample Expiration 08/28/2014   ABO/Rh     Status: None   Collection Time: 08/25/14 12:11 PM   Result Value Ref Range   ABO/RH(D) O POS     Assessment:  JUDYANN CASASOLA is a 24 y.o. [phone] female at 74w6dwith preeclampsia without severe features.  Headache and blurred vision: has not eaten today and has not taken Tylenol Normal FS blood sugar today Reactive NST  Plan:  [facility]. [facility] WLeonides [facility]chanz[facility]   2. Regular diet 3. FSBS 2hr pp and FBS in Am.   4. 24 hour urine for protein and repeat PIH labs in AM. 5. BMZ x2  Elsi Stelzer  08/25/2014 4:03 PM

## 2014-08-26 DIAGNOSIS — O1400 Mild to moderate pre-eclampsia, unspecified trimester: Secondary | ICD-10-CM | POA: Diagnosis present

## 2014-08-26 LAB — GLUCOSE, CAPILLARY
GLUCOSE-CAPILLARY: 117 mg/dL — AB (ref 65–99)
GLUCOSE-CAPILLARY: 135 mg/dL — AB (ref 65–99)
GLUCOSE-CAPILLARY: 107 mg/dL — AB (ref 65–99)
Glucose-Capillary: 141 mg/dL — ABNORMAL HIGH (ref 65–99)

## 2014-08-26 LAB — COMPREHENSIVE METABOLIC PANEL
ALBUMIN: 3.1 g/dL — AB (ref 3.5–5.0)
ALT: 12 U/L — ABNORMAL LOW (ref 14–54)
ANION GAP: 9 (ref 5–15)
AST: 17 U/L (ref 15–41)
Alkaline Phosphatase: 184 U/L — ABNORMAL HIGH (ref 38–126)
BUN: 12 mg/dL (ref 6–20)
CHLORIDE: 103 mmol/L (ref 101–111)
CO2: 23 mmol/L (ref 22–32)
CREATININE: 0.83 mg/dL (ref 0.44–1.00)
Calcium: 9.5 mg/dL (ref 8.9–10.3)
GFR calc non Af Amer: >60 mL/min (ref >60)
GLUCOSE: 135 mg/dL — AB (ref 65–99)
POTASSIUM: 4.4 mmol/L (ref 3.5–5.1)
Sodium: 135 mmol/L (ref 135–145)
TOTAL PROTEIN: 7.5 g/dL (ref 6.5–8.1)
Total Bilirubin: <0.1 mg/dL — ABNORMAL LOW (ref 0.3–1.2)

## 2014-08-26 LAB — PLATELET COUNT: PLATELETS: 226 10*3/uL (ref 150–440)

## 2014-08-26 LAB — HEMOGLOBIN A1C: Hgb A1c MFr Bld: 6.2 % — ABNORMAL HIGH (ref 4.0–6.0)

## 2014-08-26 LAB — RPR: RPR Ser Ql: NONREACTIVE

## 2014-08-26 MED ORDER — PRENATAL MULTIVITAMIN CH
1.0000 | ORAL_TABLET | Freq: Every day | ORAL | Status: DC
  Filled 2014-08-26: qty 1

## 2014-08-26 MED ORDER — FERROUS SULFATE 300 (60 FE) MG/5ML PO SYRP
300.0000 mg | ORAL_SOLUTION | Freq: Two times a day (BID) | ORAL | Status: DC
  Filled 2014-08-26 (×3): qty 5

## 2014-08-26 MED ORDER — DOCUSATE SODIUM 100 MG PO CAPS
100.0000 mg | ORAL_CAPSULE | Freq: Two times a day (BID) | ORAL | Status: DC
  Filled 2014-08-26: qty 1

## 2014-08-26 NOTE — Progress Notes (Signed)
Initial Nutrition Assessment  DOCUMENTATION CODES:     INTERVENTION:  Nutrition diet education:  RD rovided "Carbohydrate Counting for People with Diabetes" handout from the Academy of [employer] and Dietetics. Discussed different food groups and their effects on blood sugar, emphasizing carbohydrate-containing foods. Provided list of carbohydrates and recommended serving sizes of common foods.  Discussed importance of controlled and consistent carbohydrate intake throughout the day. Provided examples of ways to balance meals/snacks and encouraged intake of high-fiber, whole grain complex carbohydrates. Teach back method used.  Expect fair compliance. Pt not interested in diet education.    Coordination of care: Recommend consult to inpatient glycemic team. NUTRITION DIAGNOSIS:  Food and nutrition related knowledge deficit related to limited prior education as evidenced by  (consult for diet education).    GOAL:  Patient will meet greater than or equal to 90% of their needs    MONITOR:   (Energy intake, glucose profile)  REASON FOR ASSESSMENT:  Consult Diet education  ASSESSMENT:  Pt admitted with elevated blood glucose, [redacted] weeks gestation Past Medical History  Diagnosis Date  . IUGR (intrauterine growth restriction) in prior pregnancy, pregnant   . GBS bacteriuria   . Obesity (BMI 35.0-39.9 without comorbidity)   . Hx of preeclampsia, prior pregnancy, currently pregnant   . Depression    Current nutrition: good appetite Glucose Profile:  Recent Labs  08/25/14 2014 08/26/14 0743 08/26/14 1017  GLUCAP 121* 141* 117*    Glucose profile: Hgb a1c 6.2  Medications: colace, mvi  Ht Readings from Last 1 Encounters:  08/25/14 5\' 2"  (1.575 m)    Weight:  Wt Readings from Last 1 Encounters:  08/25/14 225 lb (102.059 kg)        Wt Readings from Last 10 Encounters:  08/25/14 225 lb (102.059 kg)  07/14/14 200 lb (90.719 kg)    BMI:  Body mass index is  41.14 kg/(m^2).     Diet Order:  Diet regular Room service appropriate?: Yes; Fluid consistency:: Thin  EDUCATION NEEDS:  Education needs addressed   Intake/Output Summary (Last 24 hours) at 08/26/14 1012 Last data filed at 08/26/14 0659  Gross per 24 hour  Intake    480 ml  Output    750 ml  Net   -270 ml      LOW Care Level Avy Barlett B. Freida Busman, RD, LDN [phone] (pager)

## 2014-08-26 NOTE — Progress Notes (Signed)
Patient has not complained of any u/c's since last night at 2345 pm when coleen gutierrez CNM was here; patient has slept well tonight after taking halcion po

## 2014-08-26 NOTE — Progress Notes (Signed)
Pt called RN to room to say she needed to go home because she has "things going on".  RN explained to pt rationale for need to stay for continued medical treatment and care.  Pt states that she is leaving "no matter what" because her family member is leaving her 25 year old at home alone.  Pt agrees to stay long enough to talk to Morse, CNM and Delice Bison, Social Work, and to receive final dose of Betamethasone.  Verne Grain, CNM in to talk with patient.  Frederico Hamman, Social Work contacted pt by phone.  Pt states that she still plans to leave and signed AMA form.  Reynold Bowen, RN 08/26/2014 5:14 PM

## 2014-08-26 NOTE — Progress Notes (Signed)
Inpatient Diabetes Program Recommendations  AACE/ADA: New Consensus Statement on Inpatient Glycemic Control (2013)  Target Ranges:  Prepandial:   less than 140 mg/dL      Peak postprandial:   less than 180 mg/dL (1-2 hours)      Critically ill patients:  140 - 180 mg/dL   Reason for Visit: consult received  Diabetes history: Gestational Diabetes Outpatient Diabetes medications: none Current orders for Inpatient glycemic control: none  Met with the patient at the bedside.  She has a meter and knows how to use it. She has an Accucheck meter but hasn't been using it because it's under a pile of clothes "after I moved" and I haven't found it.  She tells me she has plenty of testing strips and will start to check blood sugars again.  She tells me she has been previously been checking blood sugars 4 times a day and recording them. She tells me her blood sugar goals are less than 75m/dl fasting (ideally they should be 60-931mdl) and 2 hour post prandial less than 12574ml (should be less than 120m36m)- educated.   I spoke with the [facility] to see if the patient had been referred.  They received a referral for the patient to attend Gestational Diabetes classes.  They tried on June 7th and June 8th to call the patient and neither of the phone numbers they were given by the MD office will allow the [facility] to leave a message and therefore an appointment had not been scheduled.  When I offered 2 different available times for the patient to be seen at the [facility]- she interrupted me and said "I don't have a car".  I stressed the importance of good blood sugar control for the infant and for the patient and she said, "I'll take the appointment but if I can't get there, I won't be able to make it"). Patient stated, "they just need to take the baby now"; I explained the increased risk to the baby and that it was best to carry as far along in the pregnancy as long as she is healthy.  Correct number to contact the patient is [phone] patient does not have a cell phone of her own- this is the number for the father of the baby and she has given us pKoreamission to leave a message with him.   She has been scheduled for Tuesday, June 21 at 9am Dwightthe Life[facility]achsehave written this appointment down on the nutrition education information JoliJennet Maduro has left for the patient.   I have encouraged the patient to get an MD visit near the same time as the [facility] appointment to avoid excessive driving [phone]:  [phone]: [email or web address] [id number] 8th StreetrlHoneygo  [phone]

## 2014-08-26 NOTE — Progress Notes (Signed)
Patient ID: [name], female   DOB: 07/21/1990, 24 y.o.   MRN: [id number]  Provider called by patients nurse and requested to come to the bedside because the patient had stated to the nurse that she needs to go home because she has " things going on at home."  Provider arrived at the bedside and discussed the plan of care with the patient. Provider discussed with the patient that signing out of the hospital against medical advice is not recommended and that we recommend her staying to monitor her blood pressure and blood glucose levels. Pt stated that she has no one to care for her children at home as her aunt has left her home and her 42 year old will be at home alone. She is also worried about her 25 year old stating she can't get a hold of who has her currently. Pt understands that she is leaving against medical advice. She did allow her second dose of betamethasone to be given prior to her leaving. Pt states she has an appointment on Thursday in the office, as [facility].

## 2014-08-26 NOTE — Progress Notes (Signed)
Patient ID: [name], female   DOB: Jul 16, 1990, 24 y.o.   MRN: [id number] Benign Gynecology Progress Note  Admission Date: 08/25/2014 Current Date: 08/26/2014  [name] is a 24 y.o. [id number] HD#1 @ [redacted]w[redacted]d by  History complicated by: Patient Active Problem List   Diagnosis Date Noted  . Mild preeclampsia 08/26/2014  . Pregnant 08/25/2014  . Elevated blood pressure affecting pregnancy in third trimester, antepartum 08/25/2014  . Indication for care in labor and delivery, antepartum 07/14/2014   ROS and patient/family/surgical history, located on admission H&P note dated 08/25/2014, have been reviewed, and there are no changes except as noted below  Yesterday/Overnight Events:  Pt with elevated blood sugar levels fasting to 140s  Subjective:  Pt reports feeling nervous about her blood pressure and blood sugar being elevated. Wants to know if we can "get the baby out now." She reports knowing people "who have died and left their babies" from pre-eclampsia and is nervous. Pt is also refusing to eat currently due to her elevated fasting BG levels  Objective:   Filed Vitals:   08/25/14 2012 08/25/14 2247 08/26/14 0831 08/26/14 1114  BP: 128/53 121/54 126/79 131/81  Pulse: 84 75 79 85  Temp: 98.9 F (37.2 C) 97.5 F (36.4 C) 98.3 F (36.8 C) 98.1 F (36.7 C)  TempSrc: Oral Oral Oral Oral  Resp: Height:      Weight:      SpO2: 100% 99% 100%    Temp:  [97.5 F (36.4 C)-98.9 F (37.2 C)] 98.1 F (36.7 C) (06/14 1114) Pulse Rate:  [72-93] 85 (06/14 1114) Resp:  [18-22] 20 (06/14 1114) BP: (121-138)/(53-100) 131/81 mmHg (06/14 1114) SpO2:  [99 %-100 %] 100 % (06/14 0831) I/O last 3 completed shifts: In: 480 [P.O.:480] Out: 750 [Urine:750]    Intake/Output Summary (Last 24 hours) at 08/26/14 1146 Last data filed at 08/26/14 0659  Gross per 24 hour  Intake    480 ml  Output    750 ml  Net   -270 ml     Current Vital Signs 24h Vital Sign Ranges  T 98.1 F  (36.7 C) Temp  Avg: 98.1 F (36.7 C)  Min: 97.5 F (36.4 C)  Max: 98.9 F (37.2 C)  BP 131/81 mmHg BP  Min: 121/54  Max: 138/86  HR 85 Pulse  Avg: 82.3  Min: 72  Max: 93  RR 20 Resp  Avg: 19.3  Min: 18  Max: 22  SaO2 100 % Not Delivered SpO2  Avg: 99.7 %  Min: 99 %  Max: 100 %           24 Hour I/O Current Shift I/O  Time Ins Outs 06/13 0701 - 06/14 0700 In: 480 [P.O.:480] Out: 750 [Urine:750]      Physical exam: General appearance: alert, cooperative, appears stated age and no distress Abdomen: soft, non-tender; bowel sounds normal; no masses, no organomegaly GU: No gross VB Lungs: clear to auscultation bilaterally Heart: regular rate and rhythm and no MRGs Extremities: no redness or tenderness in the calves or thighs, no edema Skin: no lesions Psych: appropriate  Labs:    Recent Labs Lab 08/25/14 1208 08/26/14 0617  NA 136 135  K 3.9 4.4  CL 106 103  CO2 23 23  BUN 12 12  CREATININE 0.69 0.83  GLUCOSE 74 135*    Recent Labs Lab 08/25/14 1208  WBC 10.6  HGB 9.9*  HCT 29.8*  PLT 204  Recent Labs Lab 08/25/14 1208 08/26/14 0617  CALCIUM 8.5* 9.5   No results for input(s): INR, APTT in the last 168 hours.     Recent Labs Lab 08/25/14 1208 08/26/14 0617  ALKPHOS 164* 184*  BILITOT 0.4 <0.1*  PROT 6.6 7.5  ALT 11* 12*  AST 15 17     Recent Labs Lab 08/25/14 1208  WBC 10.6  HGB 9.9*  HCT 29.8*  PLT 204    Recent Labs Lab 08/25/14 1208 08/26/14 0617  NA 136 135  K 3.9 4.4  CL 106 103  CO2 23 23  BUN 12 12  CREATININE 0.69 0.83  CALCIUM 8.5* 9.5  PROT 6.6 7.5  BILITOT 0.4 <0.1*  ALKPHOS 164* 184*  ALT 11* 12*  AST 15 17  GLUCOSE 74 135*  Hemoglobin A1c- 6.2  Radiology NA  Assessment & Plan:  PAD 1 for pre-eclampsia without severe features Glucose intolerance   Continue plan of care with 4 times daily blood sugar checks and BP monitoring C/S date changed to 6/28 @ [redacted]w[redacted]d GA with Dr. Bonney Aid. Pt aware of date  change due to her diagnosis Will monitor BG and BP today with possible discharge tomorrow and outpatient management- Plan discussed with Dr. Vergie Living who is in agreement.

## 2014-08-28 ENCOUNTER — Observation Stay
Admission: EM | Admit: 2014-08-28 | Discharge: 2014-08-28 | Disposition: A | Discharge status: Home / Self Care | Payer: Medicaid Other | Attending: Obstetrics & Gynecology | Admitting: Obstetrics & Gynecology

## 2014-08-28 DIAGNOSIS — O26893 Other specified pregnancy related conditions, third trimester: Principal | ICD-10-CM | POA: Insufficient documentation

## 2014-08-28 LAB — PROTEIN / CREATININE RATIO, URINE
Creatinine, Urine: 70 mg/dL
Protein Creatinine Ratio: 0.64 mg/mg{Cre} — ABNORMAL HIGH (ref 0.00–0.15)
TOTAL PROTEIN, URINE: 45 mg/dL

## 2014-08-28 NOTE — OB Triage Note (Signed)
Patient here after non reactive NST at [facility] ob/gyn.  Patient reports having not had anything to eat today, only liquids.  Reports decreased fetal movement.  Also reports engorged breast with tenderness and leakage.

## 2014-08-28 NOTE — Plan of Care (Signed)
Call to Dr. Tiburcio Pea for update and lab results. May discharge patient to home. Review Pre-E signs and symptoms, labor precautions and follow up appointment on Monday. Verbalized understanding. Needs to call for a ride. Loyola Mast, RN

## 2014-08-28 NOTE — Plan of Care (Signed)
Assume patient care. Sitting up in bed eating french fries, chicken sandwich, potato chips, chocolate chip cookie and drinking grape juice. Unable to get good fetal heart rate tracing while sitting up. Will contact Darl Pikes, lactation consultant for assessment of patient breast issues. Denies recent breastfeeding. Will reposition toco and [place] when she finishes eating. Loyola Mast, RN

## 2014-09-02 ENCOUNTER — Ambulatory Visit: Payer: Medicaid Other | Admitting: *Deleted

## 2014-09-02 ENCOUNTER — Inpatient Hospital Stay
Admission: EM | Admit: 2014-09-02 | Discharge: 2014-09-06 | DRG: 765 | Disposition: A | Discharge status: Home / Self Care | Payer: Medicaid Other | Attending: Obstetrics & Gynecology | Admitting: Obstetrics & Gynecology

## 2014-09-02 DIAGNOSIS — O365930 Maternal care for other known or suspected poor fetal growth, third trimester, not applicable or unspecified: Secondary | ICD-10-CM | POA: Diagnosis present

## 2014-09-02 DIAGNOSIS — O1413 Severe pre-eclampsia, third trimester: Principal | ICD-10-CM | POA: Diagnosis present

## 2014-09-02 DIAGNOSIS — O24420 Gestational diabetes mellitus in childbirth, diet controlled: Secondary | ICD-10-CM | POA: Diagnosis present

## 2014-09-02 DIAGNOSIS — Z818 Family history of other mental and behavioral disorders: Secondary | ICD-10-CM

## 2014-09-02 DIAGNOSIS — Z8632 Personal history of gestational diabetes: Secondary | ICD-10-CM | POA: Diagnosis present

## 2014-09-02 DIAGNOSIS — Z98891 History of uterine scar from previous surgery: Secondary | ICD-10-CM

## 2014-09-02 DIAGNOSIS — E669 Obesity, unspecified: Secondary | ICD-10-CM | POA: Diagnosis present

## 2014-09-02 DIAGNOSIS — K660 Peritoneal adhesions (postprocedural) (postinfection): Secondary | ICD-10-CM | POA: Diagnosis present

## 2014-09-02 DIAGNOSIS — F1290 Cannabis use, unspecified, uncomplicated: Secondary | ICD-10-CM | POA: Diagnosis present

## 2014-09-02 DIAGNOSIS — Z9119 Patient's noncompliance with other medical treatment and regimen: Secondary | ICD-10-CM | POA: Diagnosis present

## 2014-09-02 DIAGNOSIS — Z6837 Body mass index (BMI) 37.0-37.9, adult: Secondary | ICD-10-CM

## 2014-09-02 DIAGNOSIS — O1400 Mild to moderate pre-eclampsia, unspecified trimester: Secondary | ICD-10-CM | POA: Diagnosis present

## 2014-09-02 DIAGNOSIS — O24419 Gestational diabetes mellitus in pregnancy, unspecified control: Secondary | ICD-10-CM | POA: Diagnosis present

## 2014-09-02 DIAGNOSIS — O99214 Obesity complicating childbirth: Secondary | ICD-10-CM | POA: Diagnosis present

## 2014-09-02 DIAGNOSIS — O4103X0 Oligohydramnios, third trimester, not applicable or unspecified: Secondary | ICD-10-CM | POA: Diagnosis present

## 2014-09-02 DIAGNOSIS — O99323 Drug use complicating pregnancy, third trimester: Secondary | ICD-10-CM | POA: Diagnosis present

## 2014-09-02 DIAGNOSIS — O0993 Supervision of high risk pregnancy, unspecified, third trimester: Secondary | ICD-10-CM

## 2014-09-02 DIAGNOSIS — O163 Unspecified maternal hypertension, third trimester: Secondary | ICD-10-CM | POA: Diagnosis present

## 2014-09-02 DIAGNOSIS — O3421 Maternal care for scar from previous cesarean delivery: Secondary | ICD-10-CM | POA: Diagnosis present

## 2014-09-02 DIAGNOSIS — D62 Acute posthemorrhagic anemia: Secondary | ICD-10-CM | POA: Diagnosis present

## 2014-09-02 DIAGNOSIS — O24429 Gestational diabetes mellitus in childbirth, unspecified control: Secondary | ICD-10-CM | POA: Diagnosis present

## 2014-09-02 DIAGNOSIS — Z3A36 36 weeks gestation of pregnancy: Secondary | ICD-10-CM | POA: Diagnosis present

## 2014-09-02 DIAGNOSIS — O1493 Unspecified pre-eclampsia, third trimester: Secondary | ICD-10-CM

## 2014-09-02 LAB — GLUCOSE, CAPILLARY: GLUCOSE-CAPILLARY: 136 mg/dL — AB (ref 65–99)

## 2014-09-02 MED ORDER — PRENATAL MULTIVITAMIN CH
1.0000 | ORAL_TABLET | Freq: Every day | ORAL | Status: DC
  Filled 2014-09-02: qty 1

## 2014-09-02 MED ORDER — ZOLPIDEM TARTRATE 5 MG PO TABS
5.0000 mg | ORAL_TABLET | Freq: Every evening | ORAL | Status: DC | PRN
  Administered 2014-09-02: 5 mg via ORAL
  Filled 2014-09-02: qty 1

## 2014-09-02 MED ORDER — CALCIUM CARBONATE ANTACID 500 MG PO CHEW: 2.0000 | CHEWABLE_TABLET | ORAL | Status: DC | PRN

## 2014-09-02 MED ORDER — ACETAMINOPHEN 325 MG PO TABS
650.0000 mg | ORAL_TABLET | ORAL | Status: DC | PRN
  Administered 2014-09-02: 650 mg via ORAL
  Filled 2014-09-02: qty 2

## 2014-09-02 MED ORDER — DOCUSATE SODIUM 100 MG PO CAPS
100.0000 mg | ORAL_CAPSULE | Freq: Every day | ORAL | Status: DC
  Filled 2014-09-02 (×2): qty 1

## 2014-09-02 NOTE — Plan of Care (Signed)
Problem: Consults Goal: Birthing Suites Patient Information Press F2 to bring up selections list Outcome: Not Applicable Date Met:  16/58/00  Pt < [redacted] weeks EGA and Diabetic
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
## 2014-09-03 ENCOUNTER — Inpatient Hospital Stay: Payer: Medicaid Other | Admitting: Anesthesiology

## 2014-09-03 ENCOUNTER — Inpatient Hospital Stay: Payer: Medicaid Other

## 2014-09-03 ENCOUNTER — Encounter
Admission: EM | Disposition: A | Discharge status: Home / Self Care | Payer: Self-pay | Source: Home / Self Care | Attending: Obstetrics & Gynecology

## 2014-09-03 DIAGNOSIS — Z8632 Personal history of gestational diabetes: Secondary | ICD-10-CM | POA: Diagnosis present

## 2014-09-03 LAB — CBC
HCT: 30.3 % — ABNORMAL LOW (ref 35.0–47.0)
Hemoglobin: 9.8 g/dL — ABNORMAL LOW (ref 12.0–16.0)
MCH: 28.4 pg (ref 26.0–34.0)
MCHC: 32.5 g/dL (ref 32.0–36.0)
MCV: 87.4 fL (ref 80.0–100.0)
PLATELETS: 168 10*3/uL (ref 150–440)
RBC: 3.47 MIL/uL — ABNORMAL LOW (ref 3.80–5.20)
RDW: 13.5 % (ref 11.5–14.5)
WBC: 10.8 10*3/uL (ref 3.6–11.0)

## 2014-09-03 LAB — URINE DRUG SCREEN, QUALITATIVE (ARMC ONLY)
Amphetamines, Ur Screen: NOT DETECTED
Barbiturates, Ur Screen: NOT DETECTED
Benzodiazepine, Ur Scrn: NOT DETECTED
COCAINE METABOLITE, UR ~~LOC~~: NOT DETECTED
Cannabinoid 50 Ng, Ur ~~LOC~~: POSITIVE — AB
MDMA (ECSTASY) UR SCREEN: NOT DETECTED
Methadone Scn, Ur: NOT DETECTED
OPIATE, UR SCREEN: NOT DETECTED
Phencyclidine (PCP) Ur S: NOT DETECTED
TRICYCLIC, UR SCREEN: NOT DETECTED

## 2014-09-03 LAB — COMPREHENSIVE METABOLIC PANEL
ALBUMIN: 3.1 g/dL — AB (ref 3.5–5.0)
ALT: 15 U/L (ref 14–54)
AST: 16 U/L (ref 15–41)
Alkaline Phosphatase: 156 U/L — ABNORMAL HIGH (ref 38–126)
Anion gap: 4 — ABNORMAL LOW (ref 5–15)
BUN: 9 mg/dL (ref 6–20)
CO2: 24 mmol/L (ref 22–32)
Calcium: 9.1 mg/dL (ref 8.9–10.3)
Chloride: 108 mmol/L (ref 101–111)
Creatinine, Ser: 0.75 mg/dL (ref 0.44–1.00)
GFR calc Af Amer: >60 mL/min (ref >60)
Glucose, Bld: 96 mg/dL (ref 65–99)
Potassium: 4.1 mmol/L (ref 3.5–5.1)
Sodium: 136 mmol/L (ref 135–145)
Total Bilirubin: 0.2 mg/dL — ABNORMAL LOW (ref 0.3–1.2)
Total Protein: 6.7 g/dL (ref 6.5–8.1)

## 2014-09-03 LAB — GLUCOSE, CAPILLARY
GLUCOSE-CAPILLARY: 125 mg/dL — AB (ref 65–99)
Glucose-Capillary: 119 mg/dL — ABNORMAL HIGH (ref 65–99)

## 2014-09-03 LAB — TYPE AND SCREEN
ABO/RH(D): O POS
ANTIBODY SCREEN: NEGATIVE

## 2014-09-03 LAB — PROTEIN / CREATININE RATIO, URINE
Creatinine, Urine: 34 mg/dL
PROTEIN CREATININE RATIO: 0.38 mg/mg{creat} — AB (ref 0.00–0.15)
Total Protein, Urine: 13 mg/dL

## 2014-09-03 LAB — CHLAMYDIA/NGC RT PCR (ARMC ONLY)
CHLAMYDIA TR: NOT DETECTED
N gonorrhoeae: NOT DETECTED

## 2014-09-03 SURGERY — Surgical Case
Anesthesia: Spinal | Wound class: Clean Contaminated

## 2014-09-03 MED ORDER — OXYCODONE-ACETAMINOPHEN 5-325 MG PO TABS
ORAL_TABLET | ORAL | Status: AC
  Administered 2014-09-03: 1 via ORAL
  Filled 2014-09-03: qty 1

## 2014-09-03 MED ORDER — OXYTOCIN 40 UNITS IN LACTATED RINGERS INFUSION - SIMPLE MED
INTRAVENOUS | Status: DC | PRN
  Administered 2014-09-03: 400 mL via INTRAVENOUS

## 2014-09-03 MED ORDER — LACTATED RINGERS IV SOLN
INTRAVENOUS | Status: DC
  Administered 2014-09-03 (×2): 125 mL/h via INTRAVENOUS

## 2014-09-03 MED ORDER — OXYCODONE-ACETAMINOPHEN 5-325 MG PO TABS
1.0000 | ORAL_TABLET | ORAL | Status: DC | PRN
  Administered 2014-09-03: 1 via ORAL
  Filled 2014-09-03: qty 1
  Filled 2014-09-03: qty 2

## 2014-09-03 MED ORDER — SODIUM CHLORIDE 0.9 % IJ SOLN: 3.0000 mL | INTRAMUSCULAR | Status: DC | PRN

## 2014-09-03 MED ORDER — BUPIVACAINE 0.25 % ON-Q PUMP DUAL CATH 400 ML
INJECTION | Status: AC
  Filled 2014-09-03: qty 400

## 2014-09-03 MED ORDER — MAGNESIUM SULFATE BOLUS VIA INFUSION
4.0000 g | Freq: Once | INTRAVENOUS | Status: DC
  Filled 2014-09-03: qty 500

## 2014-09-03 MED ORDER — ONDANSETRON HCL 4 MG/2ML IJ SOLN
INTRAMUSCULAR | Status: DC | PRN
  Administered 2014-09-03: 4 mg via INTRAVENOUS

## 2014-09-03 MED ORDER — FENTANYL CITRATE (PF) 250 MCG/5ML IJ SOLN
INTRAMUSCULAR | Status: DC | PRN
  Administered 2014-09-03: 15 ug via INTRATHECAL

## 2014-09-03 MED ORDER — NALBUPHINE HCL 10 MG/ML IJ SOLN
5.0000 mg | INTRAMUSCULAR | Status: DC | PRN
  Filled 2014-09-03: qty 0.5

## 2014-09-03 MED ORDER — PHENYLEPHRINE HCL 10 MG/ML IJ SOLN
INTRAMUSCULAR | Status: DC | PRN
  Administered 2014-09-03 (×2): 100 ug via INTRAVENOUS

## 2014-09-03 MED ORDER — CEFOXITIN SODIUM-DEXTROSE 2-2.2 GM-% IV SOLR (PREMIX)
2.0000 g | Freq: Once | INTRAVENOUS | Status: AC
  Administered 2014-09-03: 2 g via INTRAVENOUS

## 2014-09-03 MED ORDER — OXYCODONE-ACETAMINOPHEN 5-325 MG PO TABS
ORAL_TABLET | ORAL | Status: AC
  Filled 2014-09-03: qty 1

## 2014-09-03 MED ORDER — LABETALOL HCL 5 MG/ML IV SOLN: 40.0000 mg | Freq: Once | INTRAVENOUS | Status: AC | PRN

## 2014-09-03 MED ORDER — BUPIVACAINE HCL 0.25 % IJ SOLN
INTRAMUSCULAR | Status: DC | PRN
  Administered 2014-09-03: 10 mL

## 2014-09-03 MED ORDER — MORPHINE SULFATE (PF) 0.5 MG/ML IJ SOLN
INTRAMUSCULAR | Status: DC | PRN
Start: 2014-09-03 — End: 2014-09-03
  Administered 2014-09-03: .15 mg via INTRATHECAL

## 2014-09-03 MED ORDER — MAGNESIUM SULFATE 50 % IJ SOLN
2.0000 g/h | INTRAVENOUS | Status: AC
  Administered 2014-09-03 – 2014-09-04 (×2): 2 g/h via INTRAVENOUS
  Filled 2014-09-03: qty 80

## 2014-09-03 MED ORDER — HYDROMORPHONE HCL 1 MG/ML IJ SOLN: 0.2500 mg | INTRAMUSCULAR | Status: DC | PRN

## 2014-09-03 MED ORDER — ONDANSETRON HCL 4 MG/2ML IJ SOLN: 4.0000 mg | Freq: Once | INTRAMUSCULAR | Status: AC | PRN

## 2014-09-03 MED ORDER — MAGNESIUM SULFATE 4 GM/100ML IV SOLN
INTRAVENOUS | Status: AC
  Administered 2014-09-03: 4 g via INTRAVENOUS
  Filled 2014-09-03: qty 100

## 2014-09-03 MED ORDER — BUPIVACAINE HCL (PF) 0.5 % IJ SOLN
INTRAMUSCULAR | Status: AC
  Filled 2014-09-03: qty 30

## 2014-09-03 MED ORDER — BUPIVACAINE 0.25 % ON-Q PUMP DUAL CATH 400 ML: 400.0000 mL | INJECTION | Status: DC

## 2014-09-03 MED ORDER — OXYCODONE-ACETAMINOPHEN 5-325 MG PO TABS
1.0000 | ORAL_TABLET | ORAL | Status: DC | PRN
  Administered 2014-09-03: 1 via ORAL

## 2014-09-03 MED ORDER — FENTANYL CITRATE (PF) 100 MCG/2ML IJ SOLN
25.0000 ug | INTRAMUSCULAR | Status: DC | PRN
Start: 2014-09-03 — End: 2014-09-04

## 2014-09-03 MED ORDER — ONDANSETRON HCL 4 MG/2ML IJ SOLN: 4.0000 mg | Freq: Three times a day (TID) | INTRAMUSCULAR | Status: DC | PRN

## 2014-09-03 MED ORDER — CITRIC ACID-SODIUM CITRATE 334-500 MG/5ML PO SOLN
ORAL | Status: AC
Start: 2014-09-03 — End: 2014-09-03
  Administered 2014-09-03: 18:00:00 via OROMUCOSAL
  Filled 2014-09-03: qty 15

## 2014-09-03 MED ORDER — OXYCODONE-ACETAMINOPHEN 5-325 MG PO TABS
2.0000 | ORAL_TABLET | ORAL | Status: DC | PRN
  Administered 2014-09-04 – 2014-09-06 (×13): 2 via ORAL
  Filled 2014-09-03 (×8): qty 2
  Filled 2014-09-03: qty 1
  Filled 2014-09-03 (×2): qty 2

## 2014-09-03 MED ORDER — MAGNESIUM SULFATE 40 G IN LACTATED RINGERS - SIMPLE
INTRAVENOUS | Status: AC
  Administered 2014-09-03: 40 g via INTRAVENOUS
  Filled 2014-09-03: qty 500

## 2014-09-03 MED ORDER — OXYTOCIN 40 UNITS IN LACTATED RINGERS INFUSION - SIMPLE MED
62.5000 mL/h | INTRAVENOUS | Status: DC
  Administered 2014-09-03: 62.5 mL/h via INTRAVENOUS

## 2014-09-03 MED ORDER — NALOXONE HCL 0.4 MG/ML IJ SOLN: 0.4000 mg | INTRAMUSCULAR | Status: DC | PRN

## 2014-09-03 MED ORDER — BUPIVACAINE IN DEXTROSE 0.75-8.25 % IT SOLN
INTRATHECAL | Status: DC | PRN
  Administered 2014-09-03: 1.6 mL via INTRATHECAL

## 2014-09-03 MED ORDER — NALOXONE HCL 1 MG/ML IJ SOLN
1.0000 ug/kg/h | INTRAMUSCULAR | Status: DC | PRN
  Filled 2014-09-03: qty 2

## 2014-09-03 MED ORDER — DIPHENHYDRAMINE HCL 50 MG/ML IJ SOLN: 12.5000 mg | INTRAMUSCULAR | Status: DC | PRN

## 2014-09-03 MED ORDER — MEPERIDINE HCL 25 MG/ML IJ SOLN: 6.2500 mg | INTRAMUSCULAR | Status: DC | PRN

## 2014-09-03 MED ORDER — DIPHENHYDRAMINE HCL 25 MG PO CAPS: 25.0000 mg | ORAL_CAPSULE | ORAL | Status: DC | PRN

## 2014-09-03 MED ORDER — CEFOXITIN SODIUM-DEXTROSE 2-2.2 GM-% IV SOLR (PREMIX)
INTRAVENOUS | Status: AC
  Administered 2014-09-03: 2 g via INTRAVENOUS
  Filled 2014-09-03: qty 50

## 2014-09-03 MED ORDER — KETOROLAC TROMETHAMINE 30 MG/ML IJ SOLN: 30.0000 mg | Freq: Four times a day (QID) | INTRAMUSCULAR | Status: AC

## 2014-09-03 MED ORDER — NALBUPHINE HCL 10 MG/ML IJ SOLN: 5.0000 mg | Freq: Once | INTRAMUSCULAR | Status: AC | PRN

## 2014-09-03 SURGICAL SUPPLY — 23 items
BARRIER ADHS 3X4 INTERCEED (GAUZE/BANDAGES/DRESSINGS) ×2 IMPLANT
CANISTER SUCT 3000ML (MISCELLANEOUS) ×2 IMPLANT
CATH KIT ON-Q SILVERSOAK 5IN (CATHETERS) ×4 IMPLANT
CHLORAPREP W/TINT 26ML (MISCELLANEOUS) ×4 IMPLANT
ELECT CAUTERY BLADE 6.4 (BLADE) ×2 IMPLANT
GLOVE SKINSENSE NS SZ8.0 LF (GLOVE) ×1
GLOVE SKINSENSE STRL SZ8.0 LF (GLOVE) ×1 IMPLANT
GOWN STRL REUS W/ TWL LRG LVL3 (GOWN DISPOSABLE) ×1 IMPLANT
GOWN STRL REUS W/ TWL XL LVL3 (GOWN DISPOSABLE) ×2 IMPLANT
GOWN STRL REUS W/TWL LRG LVL3 (GOWN DISPOSABLE) ×1
GOWN STRL REUS W/TWL XL LVL3 (GOWN DISPOSABLE) ×2
LIQUID BAND (GAUZE/BANDAGES/DRESSINGS) ×2 IMPLANT
NS IRRIG 1000ML POUR BTL (IV SOLUTION) ×2 IMPLANT
PACK C SECTION AR (MISCELLANEOUS) ×2 IMPLANT
PAD GROUND ADULT SPLIT (MISCELLANEOUS) ×2 IMPLANT
PAD OB MATERNITY 4.3X12.25 (PERSONAL CARE ITEMS) ×2 IMPLANT
PAD PREP 24X41 OB/GYN DISP (PERSONAL CARE ITEMS) ×2 IMPLANT
SUT MAXON ABS #0 GS21 30IN (SUTURE) ×4 IMPLANT
SUT VIC AB 1 CT1 36 (SUTURE) ×2 IMPLANT
SUT VIC AB 2-0 CT1 36 (SUTURE) ×2 IMPLANT
SUT VIC AB 2-0 SH 27 (SUTURE) ×1
SUT VIC AB 2-0 SH 27XBRD (SUTURE) ×1 IMPLANT
SUT VIC AB 4-0 FS2 27 (SUTURE) ×2 IMPLANT

## 2014-09-03 NOTE — Anesthesia Procedure Notes (Signed)
Spinal Patient location during procedure: OB Start time: 09/03/2014 5:22 PM End time: 09/03/2014 5:25 PM Staffing Anesthesiologist: Lorane Gell Performed by: anesthesiologist  Preanesthetic Checklist Completed: patient identified, site marked, surgical consent, pre-op evaluation, timeout performed, IV checked, risks and benefits discussed and monitors and equipment checked Spinal Block Patient position: sitting Prep: Betadine Patient monitoring: heart rate, continuous pulse ox, blood pressure and cardiac monitor Approach: midline Location: L4-5 Injection technique: single-shot Needle Needle type: Whitacre and Introducer  Needle gauge: 24 G Needle length: 9 cm Assessment Sensory level: T4 Additional Notes Negative paresthesia. Negative blood return. Positive free-flowing CSF. Expiration date of kit checked and confirmed. Patient tolerated procedure well, without complications.

## 2014-09-03 NOTE — Progress Notes (Addendum)
S: Last ate at 0830 this AM. Anxious regarding surgery with many questions O:Ultrasound today revealed EFW=1962gm or 4#5oz(<1st%), with AFI<5cm. Doppler studies not available Brought to L&D after ultrasound for fetal monitoring and explained results of ultrasound and concern for fetal well being with continued pregnancy, especially in light of elevated blood sugars and preeclampsia.   Consulted DR Tiburcio Pea who spoke with patient at bedside regarding plan of management and he recommends proceeding with Csection today.  BP 144/95 mmHg  Pulse 71  Temp(Src) 98.2 F (36.8 C) (Oral)  Resp 20  Ht 5\' 2"  (1.575 m)  Wt 92.987 kg (205 lb)  BMI 37.49 kg/m2  SpO2 100%  LMP  (LMP Unknown)  Has had blood pressure elevations since arrival on L&D and with being told of plan for surgery today, some in the severe range, but they have decreased with positioning on side. FH tracing reactive with no decelerations and moderate variability Contractions frequent, but mild, not feeling most PIH labs WNL. PC ratio pending Positive UDS for MJ  A: IUP at 36.1 weeks with preeclampsia Uncontrolled GDM IUGR and oligo Reactive fetal heart tracing MJ use  P: Plan on doing CS after 1630 (8 hours after eating)-will call OR to schedule NPO Started IV Plan magnesium sulfate to start prior to surgery SCDs Antihypertensives if blood pressures persistently in the severe range Continuous fetal monitoring  Abbye Lao, CNM

## 2014-09-03 NOTE — Op Note (Signed)
Cesarean Section Procedure Note Indications: severe preeclampsia and prior cesarean section  Pre-operative Diagnosis: Intrauterine pregnancy [redacted]w[redacted]d ;  severe preeclampsia and prior cesarean section Post-operative Diagnosis: same, delivered. Procedure: Low Transverse Cesarean Section Surgeon: Annamarie Major, MD, FACOG Assistant(s): Midwife Sharen Hones Anesthesia: Spinal anesthesia Estimated Blood Loss:500 Complications: None; patient tolerated the procedure well. Disposition: PACU - hemodynamically stable. Condition: stable  Findings: A female infant in the cephalic presentation. Amniotic fluid - Clear  Birth weight 4-1.  Intact placenta with a three-vessel cord. Grossly normal uterus, tubes and ovaries bilaterally. Severe intraabdominal adhesions were noted. These were mostly at LUS to ANT ABD Wall and Bladder.  Procedure Details   The patient was taken to Operating Room, identified as the correct patient and the procedure verified as C-Section Delivery. A Time Out was held and the above information confirmed. After induction of anesthesia, the patient was draped and prepped in the usual sterile manner. A Pfannenstiel incision was made and carried down through the subcutaneous tissue to the fascia. Fascial incision was made and extended transversely with the Mayo scissors. The fascia was separated from the underlying rectus tissue superiorly and inferiorly. The peritoneum was identified and entered bluntly. Peritoneal incision was extended longitudinally. The utero-vesical peritoneal reflection was incised transversely and a bladder flap was created digitally. There was significant amount of adhesions to be dissected in this area to obtain adequate exposure. A low transverse hysterotomy was made. The fetus was delivered atraumatically. The umbilical cord was clamped x2 and cut and the infant was handed to the awaiting pediatricians. The placenta was removed intact and appeared normal with a 3-vessel  cord.  The uterus was exteriorized and cleared of all clot and debris. The hysterotomy was closed with running sutures of 0 Vicryl suture. A second imbricating layer was placed with the same suture. Excellent hemostasis was observed. The uterus was returned to the abdomen. The pelvis was irrigated and again, excellent hemostasis was noted. Interceed adhesion barrier placed. The On Q Pain pump System was then placed.  Trocars were placed through the abdominal wall into the subfascial space and these were used to thread the silver soaker cathaters into place.The rectus fascia was then reapproximated with running sutures of Maxon, with careful placement not to incorporate the cathaters. Subcutaneous tissues are then irrigated with saline and hemostasis assured.  Skin is then closed with 4-0 vicryl suture in a subcuticular fashion followed by skin adhesive. The cathaters are flushed each with 5 mL of Bupivicaine and stabilized into place with dressing. Instrument, sponge, and needle counts were correct prior to the abdominal closure and at the conclusion of the case.  The patient tolerated the procedure well and was transferred to the recovery room in stable condition.

## 2014-09-03 NOTE — Transfer of Care (Signed)
Immediate Anesthesia Transfer of Care Note  Patient: [name]  Procedure(s) Performed: Procedure(s): CESAREAN SECTION (N/A)  Patient Location: PACU  Anesthesia Type:Spinal  Level of Consciousness: awake, alert  and oriented  Airway & Oxygen Therapy: Patient Spontanous Breathing  Post-op Assessment: Report given to RN and Post -op Vital signs reviewed and stable  Post vital signs: stable  Last Vitals:  Filed Vitals:   09/03/14 1826  BP: 145/99  Pulse: 84  Temp: 35.6 C  Resp: 12    Complications: No apparent anesthesia complications

## 2014-09-03 NOTE — Progress Notes (Signed)
HTN and IUGR, Oligohydramnios, Prior h/o IUFD.  36 weeks. Counseled on risks of continued pregnancy vs CS (repeat) today.  More pros than cons of delivery today.  I am concerned about continued fetal well being.  Preclampsia is dx.  The risks of cesarean section discussed with the patient included but were not limited to: bleeding which may require transfusion or reoperation; infection which may require antibiotics; injury to bowel, bladder, ureters or other surrounding organs; injury to the fetus; need for additional procedures including hysterectomy in the event of a life-threatening hemorrhage; placental abnormalities wth subsequent pregnancies, incisional problems, thromboembolic phenomenon and other postoperative/anesthesia complications. The patient concurred with the proposed plan, giving informed written consent for the procedure.

## 2014-09-03 NOTE — Anesthesia Preprocedure Evaluation (Signed)
Anesthesia Evaluation  Patient identified by MRN, date of birth, ID band Patient awake    Reviewed: Allergy & Precautions, NPO status , [name] Chart, lab work & pertinent test results  History of Anesthesia Complications Negative for: history of anesthetic complications  Airway Mallampati: II  TM Distance: >3 FB Neck ROM: Full    Dental no notable dental hx.    Pulmonary neg pulmonary ROS, former smoker,  breath sounds clear to auscultation  Pulmonary exam normal       Cardiovascular Normal cardiovascular examRhythm:Regular Rate:Normal  Pre-eclampsia   Neuro/Psych Depression negative neurological ROS     GI/Hepatic Neg liver ROS, GERD-  ,  Endo/Other  diabetes, Gestational  Renal/GU negative Renal ROS  negative genitourinary   Musculoskeletal negative musculoskeletal ROS (+)   Abdominal   Peds negative pediatric ROS (+)  Hematology negative hematology ROS (+)   Anesthesia Other Findings   Reproductive/Obstetrics negative OB ROS                             Anesthesia Physical Anesthesia Plan  ASA: III  Anesthesia Plan: Spinal   Post-op Pain Management:    Induction:   Airway Management Planned:   Additional Equipment:   Intra-op Plan:   Post-operative Plan:   Informed Consent: I have reviewed the patients History and Physical, chart, labs and discussed the procedure including the risks, benefits and alternatives for the proposed anesthesia with the patient or authorized representative who has indicated his/her understanding and acceptance.     Plan Discussed with: CRNA and Surgeon  Anesthesia Plan Comments:         Anesthesia Quick Evaluation

## 2014-09-03 NOTE — Progress Notes (Signed)
Fasting AM blood sugar: 125

## 2014-09-03 NOTE — Progress Notes (Addendum)
24 year old G4 P2012 with EDC=09/30/2014 with preeclampsia without severe features and GDM. Admitted for monitoring of blood glucose levels as patient has been noncompliant with diet and checking blood sugars Scheduled for repeat CS (hx of CSx2) at 37 weeks (6/28) for preeclampsia PNC also complicated by marijuana use and other social and psychiatric issues.  S:Feels well. Asking about POM. Baby active.   O: BP range 120-146/74-82. afebrile CBG (last 3)   Recent Labs  09/02/14 2059 09/03/14 0651 09/03/14 0953  GLUCAP 136* 125* 119*   A: IUP at 36wk1day with preeclampsia without severe features-normotensive for the most part, occasional mildly elevated systolic GDM-blood sugars not well controlled  P: NST today Growth scan Continue monitoring CBGs-will discuss results with Dr Tiburcio Pea as far as whether to start on glyburide.  Farrel Conners, CNM

## 2014-09-03 NOTE — Discharge Summary (Signed)
Obstetrical Discharge Summary  Date of Admission: 09/02/2014 Date of Discharge: 09/06/2014  Discharge Diagnosis: Preelampsia and Oligohydramnios and Intrauterine Fetal Growth Restriction and Gestational Diabetes Primary OB:  [facility]   Gestational Age at Delivery: [redacted]w[redacted]d Antepartum complications: gestational diabetes and pregnancy-induced hypertension Date of Delivery: 09/03/14   Delivered By: Dr PBarnett ApplebaumDelivery Type: cesarean indication: previous uterine incision (CSx2) Intrapartum complications/course: None Anesthesia: spinal Placenta: spontaneous Laceration: n/a Episiotomy: none Baby: Liveborn female, Apgars 8/9, weight 4-11 lbs.   Post partum course: Since the delivery, patient has tolerate activity, diet, and daily functions without difficulty or complication.  Min lochia.  No breast concerns at this time.  No signs of depression currently.   Disposition: home with infant Rh Immune globulin given: no Rubella vaccine given: yes Varicella vaccine given: no Tdap vaccine given in AP or PP setting: given during prenatal care Flu vaccine given in AP or PP setting: not applicable Contraception: Depo-Provera, to be determined at post partum visit  Pt will receive Depo today   Prenatal Labs: O POS//Rubella Not immune//RPR negative//HIV negative/HepB Surface Ag negative//pap no abnormalities//plans to bottle feed//female  Plan:  Emily THOMwas discharged to home in good condition. [facility]  MMR and Depo today  Discharge Medications:   Medication List    TAKE these medications        ibuprofen 600 MG tablet  Commonly known as:  ADVIL,MOTRIN  Take 1 tablet (600 mg total) by mouth every 6 (six) hours as needed for mild pain.     multivitamin-prenatal 27-0.8 MG Tabs tablet  Take 1 tablet by mouth daily at 12 noon.     oxyCODONE-acetaminophen 5-325 MG per tablet  Commonly known as:  PERCOCET/ROXICET  Take 1-2 tablets by mouth every  4 (four) hours as needed (for pain scale 4-7).      ASK your doctor about these medications        acetaminophen 325 MG tablet  Commonly known as:  TYLENOL  Take 650 mg by mouth every 4 (four) hours as needed for mild pain.        Follow-up arrangements: Call on Monday to schedule a follow up for Wednesday    [name],  Emily CNorth Dakota06/25/2016 7:25 PM [facility]

## 2014-09-03 NOTE — Progress Notes (Signed)
Pt transferred to labor & delivery for a NST, plan on c/s later this evening.  Farrel Conners, CNM in to speak with pt.

## 2014-09-04 ENCOUNTER — Encounter: Payer: Self-pay | Admitting: Obstetrics & Gynecology

## 2014-09-04 DIAGNOSIS — Z98891 History of uterine scar from previous surgery: Secondary | ICD-10-CM

## 2014-09-04 LAB — RPR: RPR: NONREACTIVE

## 2014-09-04 MED ORDER — MEPERIDINE HCL 25 MG/ML IJ SOLN: 25.0000 mg | INTRAMUSCULAR | Status: DC | PRN

## 2014-09-04 MED ORDER — SENNOSIDES-DOCUSATE SODIUM 8.6-50 MG PO TABS
2.0000 | ORAL_TABLET | ORAL | Status: DC
  Administered 2014-09-05: 2 via ORAL
  Filled 2014-09-04: qty 2

## 2014-09-04 MED ORDER — OXYCODONE-ACETAMINOPHEN 5-325 MG PO TABS
ORAL_TABLET | ORAL | Status: AC
  Filled 2014-09-04: qty 2

## 2014-09-04 MED ORDER — MEASLES, MUMPS & RUBELLA VAC ~~LOC~~ INJ
0.5000 mL | INJECTION | Freq: Once | SUBCUTANEOUS | Status: AC
  Administered 2014-09-06: 0.5 mL via SUBCUTANEOUS
  Filled 2014-09-04 (×3): qty 0.5

## 2014-09-04 MED ORDER — DIPHENHYDRAMINE HCL 25 MG PO CAPS
25.0000 mg | ORAL_CAPSULE | Freq: Four times a day (QID) | ORAL | Status: DC | PRN
Start: 2014-09-04 — End: 2014-09-07

## 2014-09-04 MED ORDER — MENTHOL 3 MG MT LOZG: 1.0000 | LOZENGE | OROMUCOSAL | Status: DC | PRN

## 2014-09-04 MED ORDER — OXYTOCIN BOLUS FROM INFUSION: 500.0000 mL | INTRAVENOUS | Status: DC

## 2014-09-04 MED ORDER — OXYCODONE-ACETAMINOPHEN 5-325 MG PO TABS
ORAL_TABLET | ORAL | Status: AC
  Administered 2014-09-04: 2 via ORAL
  Filled 2014-09-04: qty 2

## 2014-09-04 MED ORDER — OXYTOCIN 40 UNITS IN LACTATED RINGERS INFUSION - SIMPLE MED
INTRAVENOUS | Status: AC
  Administered 2014-09-04: 40 [IU] via INTRAVENOUS
  Filled 2014-09-04: qty 1000

## 2014-09-04 MED ORDER — SIMETHICONE 80 MG PO CHEW
80.0000 mg | CHEWABLE_TABLET | ORAL | Status: DC | PRN
  Administered 2014-09-06: 80 mg via ORAL

## 2014-09-04 MED ORDER — LANOLIN HYDROUS EX OINT: 1.0000 "application " | TOPICAL_OINTMENT | CUTANEOUS | Status: DC | PRN

## 2014-09-04 MED ORDER — LACTATED RINGERS IV SOLN: INTRAVENOUS | Status: DC

## 2014-09-04 MED ORDER — WITCH HAZEL-GLYCERIN EX PADS: 1.0000 "application " | MEDICATED_PAD | CUTANEOUS | Status: DC | PRN

## 2014-09-04 MED ORDER — OXYTOCIN 40 UNITS IN LACTATED RINGERS INFUSION - SIMPLE MED
62.5000 mL/h | INTRAVENOUS | Status: AC
  Administered 2014-09-05: 62.5 mL/h via INTRAVENOUS
  Filled 2014-09-04: qty 1000

## 2014-09-04 MED ORDER — PRENATAL MULTIVITAMIN CH
1.0000 | ORAL_TABLET | Freq: Every day | ORAL | Status: DC
  Filled 2014-09-04: qty 1

## 2014-09-04 MED ORDER — SIMETHICONE 80 MG PO CHEW
80.0000 mg | CHEWABLE_TABLET | ORAL | Status: DC
  Filled 2014-09-04: qty 1

## 2014-09-04 MED ORDER — ACETAMINOPHEN 325 MG PO TABS: 650.0000 mg | ORAL_TABLET | ORAL | Status: DC | PRN

## 2014-09-04 MED ORDER — ZOLPIDEM TARTRATE 5 MG PO TABS: 5.0000 mg | ORAL_TABLET | Freq: Every evening | ORAL | Status: DC | PRN

## 2014-09-04 MED ORDER — SIMETHICONE 80 MG PO CHEW
80.0000 mg | CHEWABLE_TABLET | Freq: Three times a day (TID) | ORAL | Status: DC
  Administered 2014-09-05 – 2014-09-06 (×3): 80 mg via ORAL
  Filled 2014-09-04 (×3): qty 1

## 2014-09-04 MED ORDER — DIBUCAINE 1 % RE OINT: 1.0000 "application " | TOPICAL_OINTMENT | RECTAL | Status: DC | PRN

## 2014-09-04 NOTE — Anesthesia Postprocedure Evaluation (Signed)
  Anesthesia Post-op Note  Patient: [name]  Procedure(s) Performed: Procedure(s): CESAREAN SECTION (N/A)  Anesthesia type:Spinal  Patient location: PACU  Post pain: Pain level controlled  Post assessment: Post-op Vital signs reviewed, [name] Cardiovascular Status Stable, Respiratory Function Stable, Patent Airway and No signs of Nausea or vomiting  Post vital signs: Reviewed and stable  Last Vitals:  Filed Vitals:   09/04/14 0615  BP: 113/68  Pulse: 90  Temp: 36.7 C  Resp: 18    Level of consciousness: awake, alert  and patient cooperative  Complications: No apparent anesthesia complications

## 2014-09-04 NOTE — Plan of Care (Signed)
Problem: Phase II Progression Outcomes Goal: Tolerating diet Outcome: Progressing Pt advancing with snacks and clear fluids

## 2014-09-04 NOTE — Progress Notes (Signed)
Subjective: Postpartum Day 1: Repeat Cesarean Delivery for Pre-eclampsia with severe features  Patient reports that her pain is currently controlled.   Objective: Vital signs in last 24 hours: Temp:  [96 F (35.6 C)-98.5 F (36.9 C)] 97.8 F (36.6 C) (06/23 0715) Pulse Rate:  [59-110] 95 (06/23 0915) Resp:  [12-20] 18 (06/23 0930) BP: (95-172)/(26-119) 111/70 mmHg (06/23 0915) SpO2:  [97 %-100 %] 97 % (06/23 0915)  Physical Exam:  General: cooperative, appears stated age, no distress and Pt sleeping in the bed currently  Lochia: appropriate Uterine Fundus: firm Incision: OTA c/d/i   DVT Evaluation: No evidence of DVT seen on physical exam. SCDs in place    Recent Labs  09/03/14 1318  HGB 9.8*  HCT 30.3*    Assessment/Plan: Status post Cesarean section for pre-eclampsia with severe features currently on magnesium   Continue current care. Will d/c magnesium this evening at 5pm. Foley to be d/c at that time as well Baby in SCN. Mother bottle feeding/ pumping occasionally.   Jannet Mantis 09/04/2014, 9:59 AM

## 2014-09-04 NOTE — Anesthesia Post-op Follow-up Note (Signed)
  Anesthesia Pain Follow-up Note  Patient: [name]  Day #: 1  Date of Follow-up: 09/04/2014 Time: 7:21 AM  Last Vitals:  Filed Vitals:   09/04/14 0615  BP: 113/68  Pulse: 90  Temp: 36.7 C  Resp: 18    Level of Consciousness: alert  Pain: none   Side Effects:None  Catheter Site Exam:clean, dry, no drainage  Plan: Continue current therapy  Kenora Spayd,  Sheran Fava

## 2014-09-04 NOTE — Progress Notes (Signed)
Pt's foley dc'd.  Transferred via bed to PP RM 350 in stable condition

## 2014-09-05 LAB — CBC
HCT: 28.1 % — ABNORMAL LOW (ref 35.0–47.0)
Hemoglobin: 9.3 g/dL — ABNORMAL LOW (ref 12.0–16.0)
MCH: 28.9 pg (ref 26.0–34.0)
MCHC: 32.9 g/dL (ref 32.0–36.0)
MCV: 87.9 fL (ref 80.0–100.0)
PLATELETS: 179 10*3/uL (ref 150–440)
RBC: 3.2 MIL/uL — ABNORMAL LOW (ref 3.80–5.20)
RDW: 13.6 % (ref 11.5–14.5)
WBC: 12.1 10*3/uL — ABNORMAL HIGH (ref 3.6–11.0)

## 2014-09-05 NOTE — Clinical Social Work Note (Signed)
Clinical Social Work Assessment  Patient Details  Name: [name] MRN: [id number] Date of Birth: 1990/03/18  Date of referral:  09/05/14               Reason for consult:  Financial Concerns, Transportation, [employer], Family Concerns                Permission sought to share information with:  Other Investment banker, corporate) Permission granted to share information::  Yes, Verbal Permission Granted  Name::      RN- Medical illustrator::     Relationship::     Contact Information:     Housing/Transportation Living arrangements for the past 2 months:  Single Family Home Source of Information:  Patient Patient Interpreter Needed:  None Criminal Activity/Legal Involvement Pertinent to Current Situation/Hospitalization:  No - Comment as needed Significant Relationships:  Other Family Members Lives with:  Other (Comment) (Aunt) Do you feel safe going back to the place where you live?  Yes Need for family participation in patient care:  Yes (Comment)   Social Worker assessment / plan: Holiday representative ([employer]) received consult for financial concerns. CSW met with patient and her newborn was at bedside. CSW introduced self and explained role of CSW department. Patient reported that she was working at [employer] in [place] but has not been working since January. Patient reported that she was staying with her boyfriend in [place] however she had to move to [facility] because things got tight financially. Patient has 2 other children that are staying with family members now until patient can get a place of her own. Patient lived with her Uncle for a while and is planning on staying with her aunt in [place] after hospitalization. Per RN patient will likely D/C from the hospital tomorrow. CSW made patient aware of likely D/C Saturday. Patient reported that her boyfriend can pick her up from hospital. Patient reported that she does receive WIC benefits and is on Medicaid. Patient  reported that she did meet with the [facility] representative at the hospital. Patient reported that she needs diapers, wipes, and bottles. Per RN the hospital will provide her with those things. CSW also gave patient information for community resources including [facility] [place]. Plan is for patient to D/C home tomorrow.      Employment status:  Unemployed Forensic scientist:  Medicaid In [place] PT Recommendations:  Not assessed at this time Information / Referral to community resources:  Other (Comment Required) (Community Resouces )  Patient/Family's Response to care:  Patient is agreeable to D/C'ing home tomorrow.   Patient/Family's Understanding of and Emotional Response to Diagnosis, Current Treatment, and Prognosis:  Patient was pleasant and sitting up in the chair. Patient thanked CSW for provided resources.    Emotional Assessment Appearance:  Appears younger than stated age Attitude/Demeanor/Rapport:    Affect (typically observed):  Quiet Orientation:  Oriented to Self, Oriented to Place, Oriented to  Time Alcohol / Substance use:  Not Applicable Psych involvement (Current and /or in the community):  No (Comment)  Discharge Needs  Concerns to be addressed:  Financial / Insurance Concerns Readmission within the last 30 days:  No Current discharge risk:  Inadequate Financial Supports Barriers to Discharge:  Continued Medical Work up   Elwyn Reach 09/05/2014, 4:28 PM

## 2014-09-05 NOTE — Progress Notes (Signed)
  Subjective:   Doing well.  No complaints.  Voiding, ambulating, tolerating regular PO diet, starting to tolerate pain with PO meds. Denies: CP SOB F/C, N/V, calf pain    Objective:  Blood pressure 130/66, pulse 88, temperature 98 F (36.7 C), temperature source Oral, resp. rate 18, height 5\' 2"  (1.575 m), weight 92.987 kg (205 lb), SpO2 100 %   General: NAD Pulmonary: no increased work of breathing Abdomen: non-distended, non-tender, fundus firm at level of umbilicus Incision: c/d/i on-q pump intact Extremities: no edema, no erythema, no tenderness  Results for orders placed or performed during the hospital encounter of 09/02/14 (from the past 24 hour(s))  CBC     Status: Abnormal   Collection Time: 09/05/14  4:28 AM  Result Value Ref Range   WBC 12.1 (H) 3.6 - 11.0 K/uL   RBC 3.20 (L) 3.80 - 5.20 MIL/uL   Hemoglobin 9.3 (L) 12.0 - 16.0 g/dL   HCT 24.2 (L) 68.3 - 41.9 %   MCV 87.9 80.0 - 100.0 fL   MCH 28.9 26.0 - 34.0 pg   MCHC 32.9 32.0 - 36.0 g/dL   RDW 62.2 29.7 - 98.9 %   Platelets 179 150 - 440 K/uL    Date 09/05/14 0701 - 09/06/14 0700  Shift 0701-1900 1901-0700 24 Hour Total  I N T A K E  I.V. (mL/kg) 4484.4 (48.2)  4484.4 (48.2)     Volume (mL) Oxytocin 4484.4  4484.4   Shift Total (mL/kg) 4484.4 (48.2)  4484.4 (48.2)  O U T P U T  Shift Total (mL/kg)     NET 4484.4  4484.4  Weight (kg) 93 93 93     Assessment:   24 y.o. [id number] postoperativeday # 2 for repeat CS due to preeclampsia with severe features   Plan:  1) Acute blood loss anemia - hemodynamically stable and asymptomatic - po ferrous sulfate BID  2) no signs of worsening preeclampsia, BPs mild to normal range, s/p mag sulfate.  3) continue routine post op management.  4) encourage OOB, may shower.

## 2014-09-05 NOTE — Care Management (Signed)
Received referral for wanting more information on medicaid, needing baby clothes and a car seat. Spoke with Ms. Gutridge at the bedside. States that she is currently a resident in [place], but was going to live with a family member in [place] when discharged.  Discussed the application process for medicaid in [place]. "My other 2 children have Medicaid in [facility]. I will call my case worker in [place]. Gave Ms. Igoe premie clothes from the closet. Informed Ms. Siegmann that we could give her pampers that were opened for her baby when discharged. Informed Ms. Hamner that she would need to borrow a car seat from a friend or relative. [facility] has no car seats to give away.  Nurse Isabelle Course updated.  Gwenette Greet RN MSN Care Management [phone]

## 2014-09-06 MED ORDER — MEDROXYPROGESTERONE ACETATE 150 MG/ML IM SUSP: 150.0000 mg | Freq: Once | INTRAMUSCULAR | Status: DC

## 2014-09-06 MED ORDER — IBUPROFEN 600 MG PO TABS: 600.0000 mg | ORAL_TABLET | Freq: Four times a day (QID) | ORAL | Status: DC | PRN

## 2014-09-06 MED ORDER — OXYCODONE-ACETAMINOPHEN 5-325 MG PO TABS: 1.0000 | ORAL_TABLET | ORAL | Status: DC | PRN

## 2014-09-06 NOTE — Lactation Note (Signed)
This note was copied from the chart of [name]. Mom reports wanting to breastfeed.  Discussed risks of marijuana exposure to infant and alternative of putting baby to breast for now.  Mom agrees to pump and dump breast milk until tests negative for cannabanoids.  Demonstrated use of hand pump until can get to [facility] on Monday to get DEBP.

## 2014-09-06 NOTE — Progress Notes (Signed)
Clinical Child psychotherapist (CSW) received call from RN that patient's baby failed the car seat test and needs a 4 pound car seat. CSW discussed case with clinical supervisor Delice Bison who reported that a 4 pound car seat cannot be obtained until Monday. CSW made RN aware of above. CSW will continue to follow and assist as needed.   Jetta Lout, LCSWA [phone]

## 2014-09-06 NOTE — Progress Notes (Signed)
Subjective: Postpartum Day 3: Cesarean Delivery Patient reports incisional pain, tolerating PO and no problems voiding.    Objective: Vital signs in last 24 hours: Temp:  [97.7 F (36.5 C)-99 F (37.2 C)] 97.7 F (36.5 C) (06/25 0720) Pulse Rate:  [76-99] 76 (06/25 0720) Resp:  [18] 18 (06/25 0720) BP: (126-160)/(66-99) 126/93 mmHg (06/25 0720) SpO2:  [96 %-99 %] 96 % (06/25 0720)  Physical Exam:  General: alert, cooperative, appears stated age and no distress Lochia: appropriate Uterine Fundus: firm Incision: healing well, OTA C/D/I DVT Evaluation: No evidence of DVT seen on physical exam.   Recent Labs  09/03/14 1318 09/05/14 0428  HGB 9.8* 9.3*  HCT 30.3* 28.1*    Assessment/Plan: Status post Cesarean section. Doing well postoperatively.  Continue current care- possibly discharge later today if baby passes car seat test.  Anticipate discharge tomorrow Bottle feeding   Jannet Mantis 09/06/2014, 12:30 PM

## 2014-09-06 NOTE — Discharge Instructions (Signed)
Please call your doctor or return to the ER if you experience any chest pains, shortness of breath, fever greater than 101, any heavy bleeding or large clots, and foul smelling vaginal discharge, any worsening abdominal pain & cramping that is not controlled by pain medication, or any signs of post partum depression.  No tampons, enemas, douches, or sexual intercourse for 6 weeks.  Also avoid tub baths, hot tubs, or swimming for 6 weeks.  Keep your incision clean and dry.  Let your doctor know if you have any problems with your incision such as redness, swelling, or drainage or if it feels hot to touch.

## 2014-09-08 ENCOUNTER — Other Ambulatory Visit: Payer: Self-pay

## 2014-09-09 ENCOUNTER — Inpatient Hospital Stay
Admission: RE | Admit: 2014-09-09 | Payer: Medicaid Other | Source: Ambulatory Visit | Admitting: Obstetrics & Gynecology

## 2014-09-09 ENCOUNTER — Encounter: Admission: RE | Payer: Self-pay | Source: Ambulatory Visit

## 2014-09-09 SURGERY — Surgical Case
Anesthesia: Spinal

## 2014-09-24 ENCOUNTER — Other Ambulatory Visit: Payer: Self-pay

## 2015-05-22 IMAGING — CT CT HEAD W/O CM
1 series · 16 of 30 positions shown, 20 images · non-contrast
Comparison: Head CT 02/14/2012

CLINICAL DATA: Syncopal episode, unresponsive.  Hit head.

EXAM:
CT HEAD WITHOUT CONTRAST
TECHNIQUE: Contiguous axial images were obtained from the base of the skull
through the vertex without intravenous contrast.

[Series 2: head wo · axial · 0.40mm/px · z∈[-122,+4]mm · 16 of 32 slices shown, 20 images]
[im 2/32  brain]
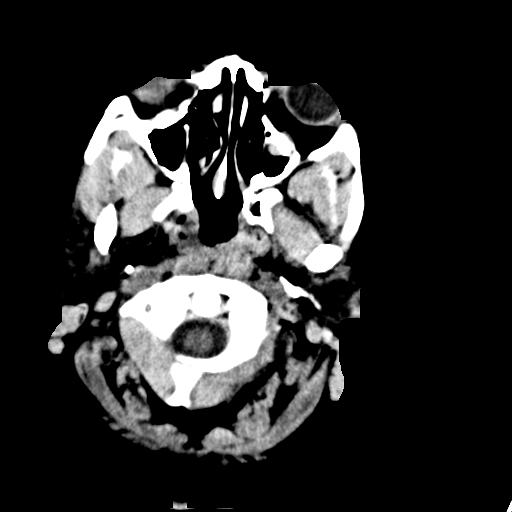
[im 2/32  bone]
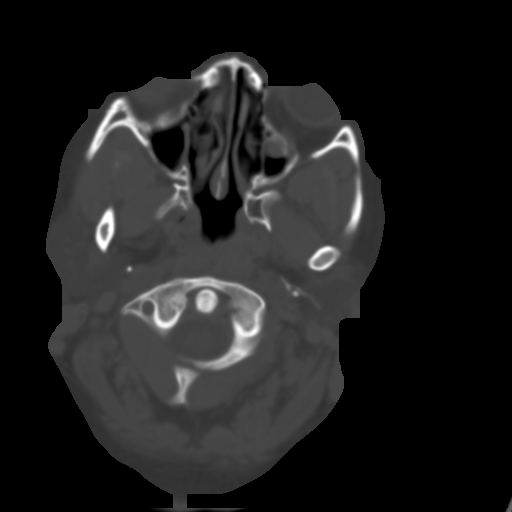
[im 4/32  brain]
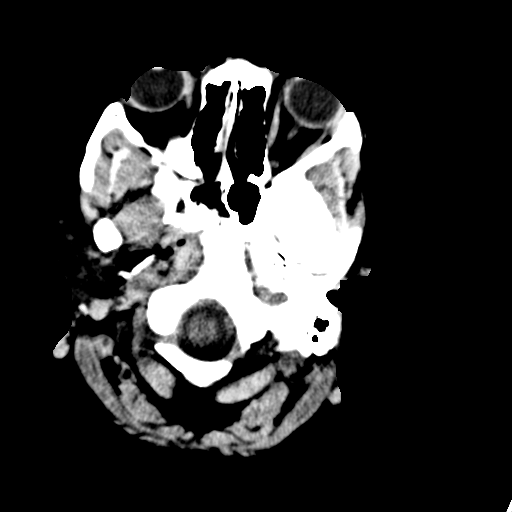
[im 6/32  brain]
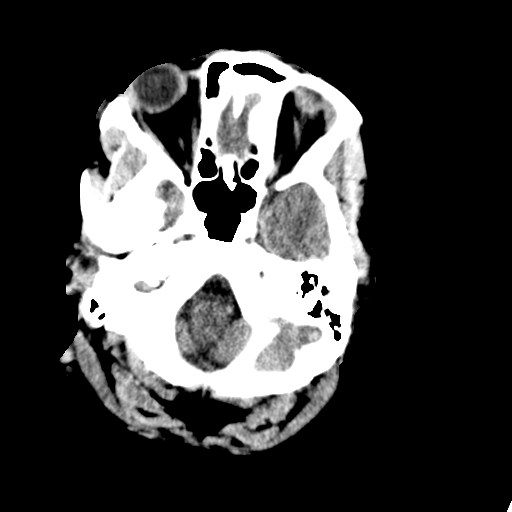
[im 8/32  brain]
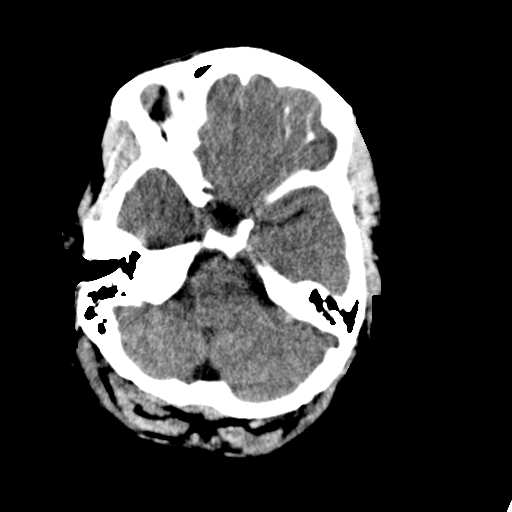
[im 9/32  brain]
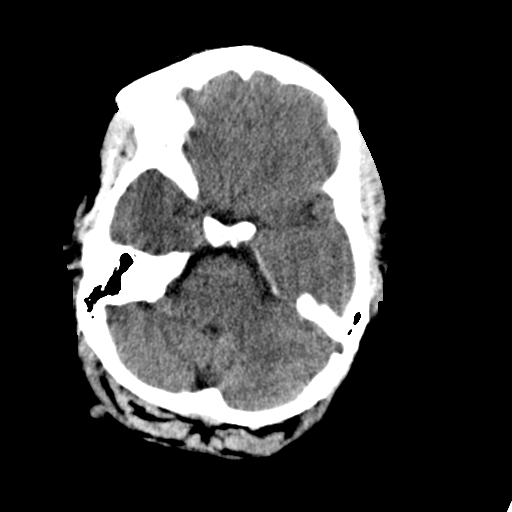
[im 9/32  bone]
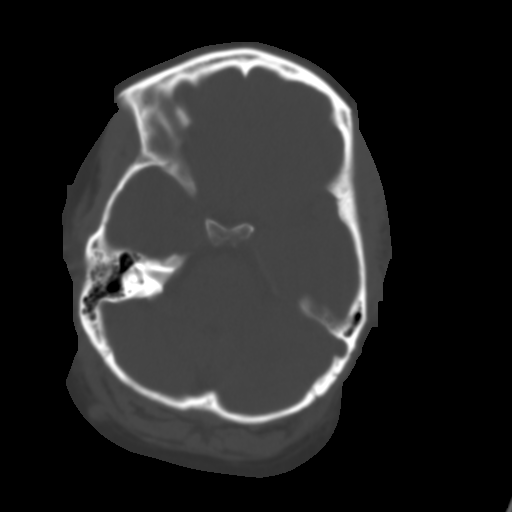
[im 11/32  brain]
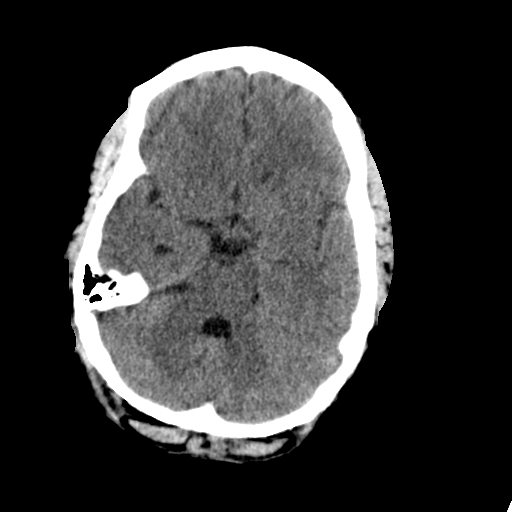
[im 13/32  brain]
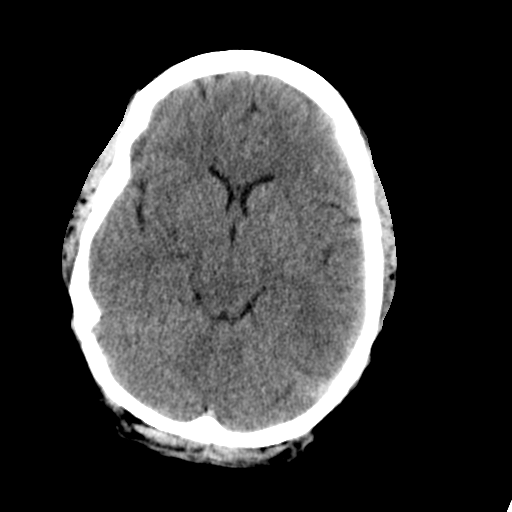
[im 15/32  brain]
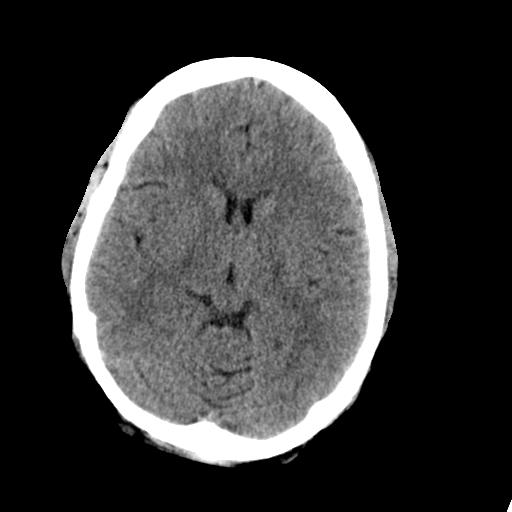
[im 17/32  brain]
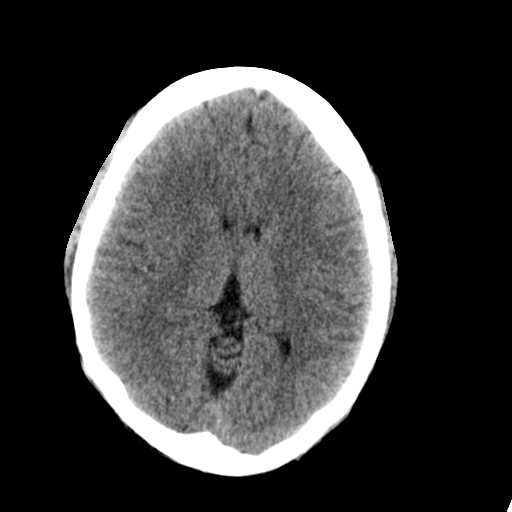
[im 17/32  bone]
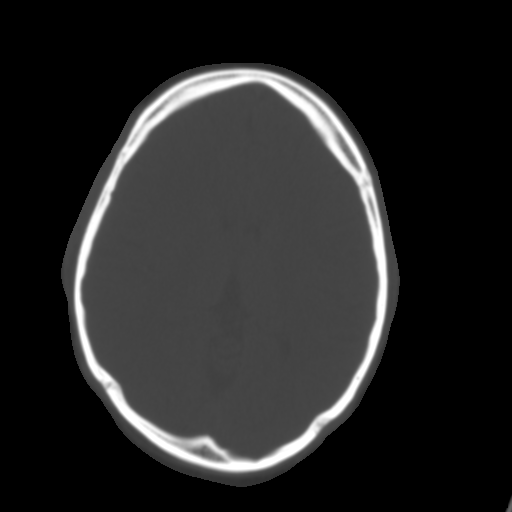
[im 19/32  brain]
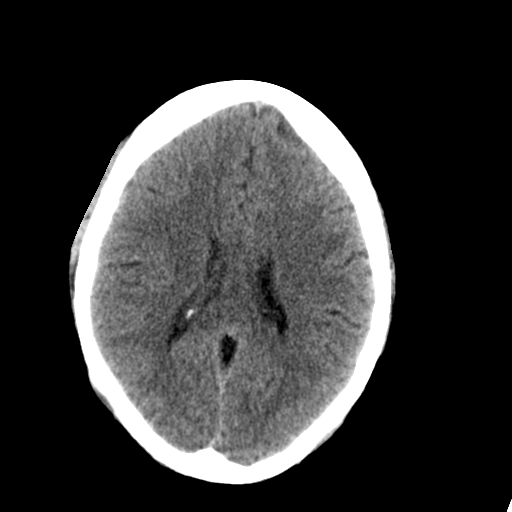
[im 21/32  brain]
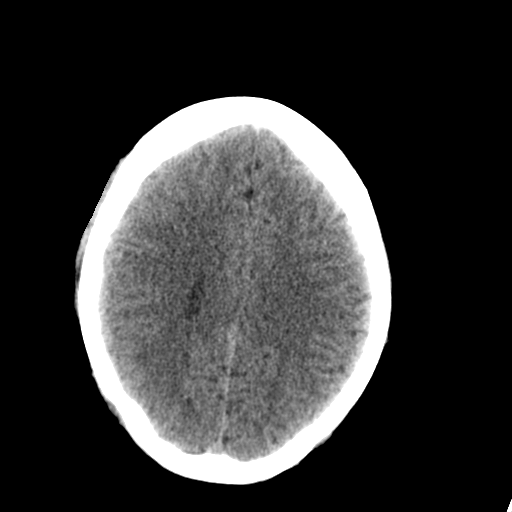
[im 23/32  brain]
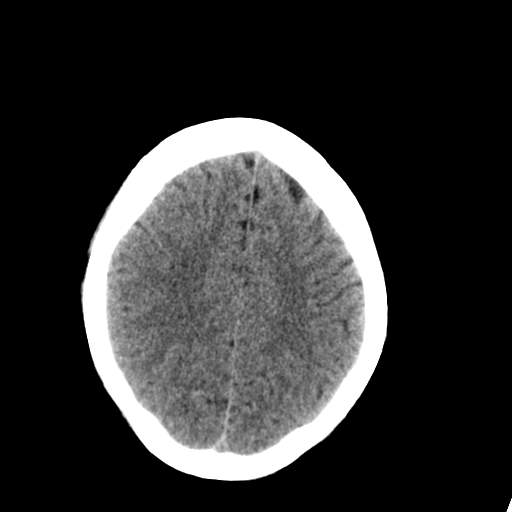
[im 24/32  brain]
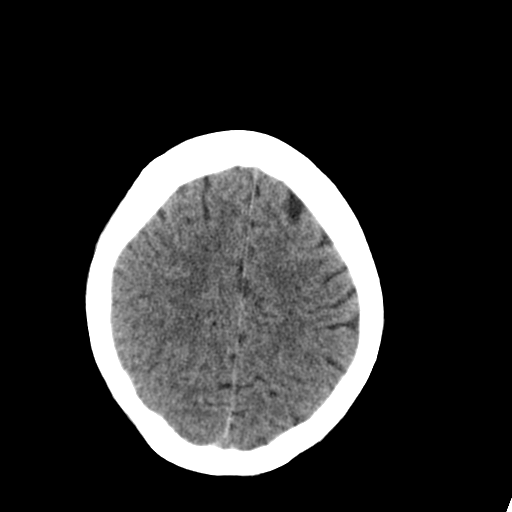
[im 24/32  bone]
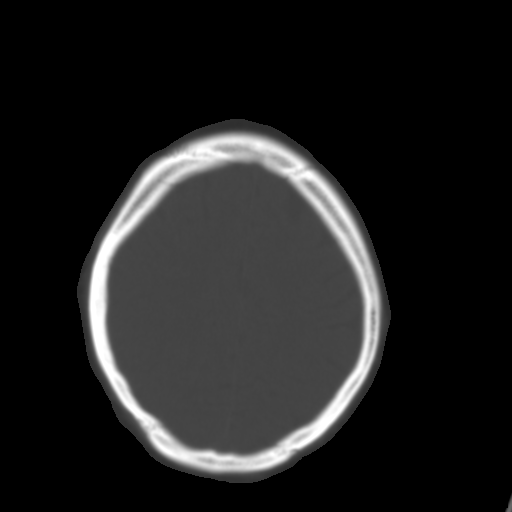
[im 26/32  brain]
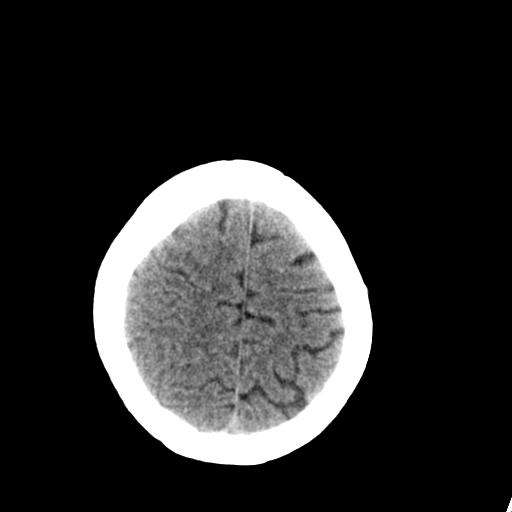
[im 28/32  brain]
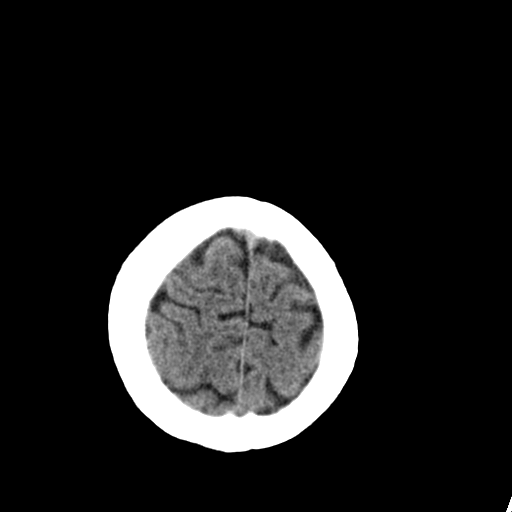
[im 30/32  brain]
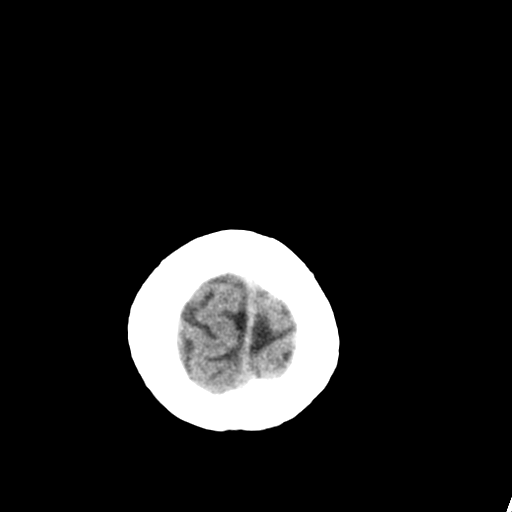

[16 of 30 positions shown; findings below may reference images not displayed]

FINDINGS: No intracranial hemorrhage. No parenchymal contusion. No midline
shift or mass effect. Basilar cisterns are patent. No skull base
fracture. No fluid in the paranasal sinuses or mastoid air cells.
Orbits are normal. There is mucosal thickening within the maxillary
sinuses.
IMPRESSION: 1. No intracranial trauma.
2. Mild maxillary sinus inflammation.

## 2015-07-02 ENCOUNTER — Emergency Department
Admission: EM | Admit: 2015-07-02 | Discharge: 2015-07-03 | Disposition: A | Discharge status: Home / Self Care | Payer: Medicaid Other | Attending: Emergency Medicine | Admitting: Emergency Medicine

## 2015-07-02 DIAGNOSIS — E119 Type 2 diabetes mellitus without complications: Secondary | ICD-10-CM | POA: Diagnosis not present

## 2015-07-02 DIAGNOSIS — G40909 Epilepsy, unspecified, not intractable, without status epilepticus: Secondary | ICD-10-CM | POA: Insufficient documentation

## 2015-07-02 DIAGNOSIS — Z794 Long term (current) use of insulin: Secondary | ICD-10-CM | POA: Insufficient documentation

## 2015-07-02 DIAGNOSIS — F329 Major depressive disorder, single episode, unspecified: Secondary | ICD-10-CM | POA: Diagnosis not present

## 2015-07-02 DIAGNOSIS — Z87891 Personal history of nicotine dependence: Secondary | ICD-10-CM | POA: Diagnosis not present

## 2015-07-02 DIAGNOSIS — F419 Anxiety disorder, unspecified: Secondary | ICD-10-CM

## 2015-07-02 DIAGNOSIS — Z79899 Other long term (current) drug therapy: Secondary | ICD-10-CM | POA: Diagnosis not present

## 2015-07-02 DIAGNOSIS — I10 Essential (primary) hypertension: Secondary | ICD-10-CM | POA: Diagnosis not present

## 2015-07-02 DIAGNOSIS — R569 Unspecified convulsions: Secondary | ICD-10-CM

## 2015-07-02 DIAGNOSIS — E669 Obesity, unspecified: Secondary | ICD-10-CM | POA: Insufficient documentation

## 2015-07-02 DIAGNOSIS — Z7984 Long term (current) use of oral hypoglycemic drugs: Secondary | ICD-10-CM | POA: Diagnosis not present

## 2015-07-02 HISTORY — DX: Essential (primary) hypertension: I10

## 2015-07-02 HISTORY — DX: Type 2 diabetes mellitus without complications: E11.9

## 2015-07-02 HISTORY — DX: Unspecified convulsions: R56.9

## 2015-07-02 LAB — URINE DRUG SCREEN, QUALITATIVE (ARMC ONLY)
Amphetamines, Ur Screen: NOT DETECTED
BARBITURATES, UR SCREEN: NOT DETECTED
BENZODIAZEPINE, UR SCRN: POSITIVE — AB
Cannabinoid 50 Ng, Ur ~~LOC~~: POSITIVE — AB
Cocaine Metabolite,Ur ~~LOC~~: NOT DETECTED
MDMA (Ecstasy)Ur Screen: NOT DETECTED
Methadone Scn, Ur: NOT DETECTED
OPIATE, UR SCREEN: NOT DETECTED
PHENCYCLIDINE (PCP) UR S: NOT DETECTED
Tricyclic, Ur Screen: NOT DETECTED

## 2015-07-02 LAB — CBC WITH DIFFERENTIAL/PLATELET
BASOS PCT: 1 %
Basophils Absolute: 0 10*3/uL (ref 0–0.1)
EOS PCT: 1 %
Eosinophils Absolute: 0.1 10*3/uL (ref 0–0.7)
HCT: 34.2 % — ABNORMAL LOW (ref 35.0–47.0)
Hemoglobin: 11.3 g/dL — ABNORMAL LOW (ref 12.0–16.0)
LYMPHS ABS: 2.8 10*3/uL (ref 1.0–3.6)
Lymphocytes Relative: 31 %
MCH: 27.2 pg (ref 26.0–34.0)
MCHC: 32.9 g/dL (ref 32.0–36.0)
MCV: 82.5 fL (ref 80.0–100.0)
MONOS PCT: 7 %
Monocytes Absolute: 0.6 10*3/uL (ref 0.2–0.9)
Neutro Abs: 5.5 10*3/uL (ref 1.4–6.5)
Neutrophils Relative %: 60 %
PLATELETS: 297 10*3/uL (ref 150–440)
RBC: 4.15 MIL/uL (ref 3.80–5.20)
RDW: 16.3 % — ABNORMAL HIGH (ref 11.5–14.5)
WBC: 9 10*3/uL (ref 3.6–11.0)

## 2015-07-02 LAB — COMPREHENSIVE METABOLIC PANEL
ALT: 15 U/L (ref 14–54)
AST: 34 U/L (ref 15–41)
Albumin: 4.6 g/dL (ref 3.5–5.0)
Alkaline Phosphatase: 63 U/L (ref 38–126)
Anion gap: 11 (ref 5–15)
BUN: 11 mg/dL (ref 6–20)
CHLORIDE: 107 mmol/L (ref 101–111)
CO2: 20 mmol/L — AB (ref 22–32)
CREATININE: 0.8 mg/dL (ref 0.44–1.00)
Calcium: 9.2 mg/dL (ref 8.9–10.3)
GFR calc Af Amer: >60 mL/min (ref >60)
Glucose, Bld: 92 mg/dL (ref 65–99)
Potassium: 3.6 mmol/L (ref 3.5–5.1)
Sodium: 138 mmol/L (ref 135–145)
Total Bilirubin: 0.6 mg/dL (ref 0.3–1.2)
Total Protein: 7.8 g/dL (ref 6.5–8.1)

## 2015-07-02 LAB — SALICYLATE LEVEL: Salicylate Lvl: <4 mg/dL (ref 2.8–30.0)

## 2015-07-02 LAB — URINALYSIS COMPLETE WITH MICROSCOPIC (ARMC ONLY)
BILIRUBIN URINE: NEGATIVE
Bacteria, UA: NONE SEEN
GLUCOSE, UA: NEGATIVE mg/dL
HGB URINE DIPSTICK: NEGATIVE
LEUKOCYTES UA: NEGATIVE
NITRITE: NEGATIVE
PH: 6 (ref 5.0–8.0)
Protein, ur: 100 mg/dL — AB
SPECIFIC GRAVITY, URINE: 1.025 (ref 1.005–1.030)

## 2015-07-02 LAB — ETHANOL

## 2015-07-02 LAB — POCT PREGNANCY, URINE: Preg Test, Ur: NEGATIVE

## 2015-07-02 LAB — ACETAMINOPHEN LEVEL: Acetaminophen (Tylenol), Serum: <10 ug/mL — ABNORMAL LOW (ref 10–30)

## 2015-07-02 MED ORDER — KETOROLAC TROMETHAMINE 30 MG/ML IJ SOLN
30.0000 mg | Freq: Once | INTRAMUSCULAR | Status: AC
  Administered 2015-07-02: 30 mg via INTRAVENOUS

## 2015-07-02 MED ORDER — SODIUM CHLORIDE 0.9 % IV BOLUS (SEPSIS)
1000.0000 mL | Freq: Once | INTRAVENOUS | Status: AC
  Administered 2015-07-02: 1000 mL via INTRAVENOUS

## 2015-07-02 MED ORDER — LORAZEPAM 2 MG/ML IJ SOLN
1.0000 mg | Freq: Once | INTRAMUSCULAR | Status: AC
  Administered 2015-07-02: 1 mg via INTRAVENOUS

## 2015-07-02 NOTE — ED Notes (Signed)
Lab called and cbc tube had clotted - we will redraw tube

## 2015-07-02 NOTE — ED Notes (Addendum)
Per ems report pt had 3 seizures back to back lasting approx 2 minutes in total - the seizures were witnessed by family - CBG on ems was 107 - pt c/o lt arm pain but per family this is a normal complaint - pt has hx of DM, HTN, and seizures - pt is agitated and crying - at times screaming and throwing the seizures pads off of bed - talking to pt and trying to calm her seems to work for short periods of time - pt had $65 in her pants pocket that I notified her mother about when she arrived

## 2015-07-02 NOTE — ED Notes (Signed)
2206 - during pt agitated state she pulled IV half of the way out of arm and the area began to bleed - she then removed it the rest of the way and demanded that another one be started - charge nurse Sue Lushndrea was notified pt agitated stated as was Dr Janit BernScheavitz

## 2015-07-02 NOTE — ED Provider Notes (Addendum)
[facility] Emergency Department Provider Note  ____________________________________________  Time seen: Seen upon arrival to the emergency department  I have reviewed the triage vital signs and the nursing notes.   HISTORY  Chief Complaint Seizures   HPI DARON BREEDING is a 25 y.o. female with a history of seizure and depression who is presenting to the emergency department today after seizures lasting a total of 2 minutes. Per EMS the family witnessed 3 seizure episodes over the course of 2 minutes. The patient was unconscious and had generalized tonic-clonic activity. She also had loss of urinary continence. After the seizures she has been tearful and is complaining of stiffness to her left upper lower extremities in the musculature which the medics in the family told him was typical after seizures. The patient is noncompliant with her medications and has been off of all medication for 6 months now. She also has a history of diabetes. Her glucose was taken in the field and was found to be 106.  EMS also reported the patient's children were taken into custody by CPS today and that the patient often becomes irate, per family, when she is very stressed.   Past Medical History  Diagnosis Date  . IUGR (intrauterine growth restriction) in prior pregnancy, pregnant   . GBS bacteriuria   . Obesity (BMI 35.0-39.9 without comorbidity) (HCC)   . Hx of preeclampsia, prior pregnancy, currently pregnant   . Depression   . Hypertension   . Diabetes mellitus without complication (HCC)   . Seizures [facility])     Patient Active Problem List   Diagnosis Date Noted  . S/P cesarean section 09/04/2014  . GDM (gestational diabetes mellitus) 09/03/2014  . Gestational diabetes mellitus in third trimester 09/02/2014  . Antepartum mild preeclampsia 09/02/2014  . Mild preeclampsia 08/26/2014  . Pregnant 08/25/2014  . Elevated blood pressure affecting pregnancy in third trimester,  antepartum 08/25/2014  . Indication for care in labor and delivery, antepartum 07/14/2014    Past Surgical History  Procedure Laterality Date  . Cesarean section    . Cholecystectomy, laparoscopic    . Cesarean section N/A 09/03/2014    Procedure: CESAREAN SECTION;  Surgeon: Nadara Mustard, MD;  Location: [facility] ORS;  Service: Obstetrics;  Laterality: N/A;    Current Outpatient Rx  Name  Route  Sig  Dispense  Refill  . ibuprofen (ADVIL,MOTRIN) 600 MG tablet   Oral   Take 1 tablet (600 mg total) by mouth every 6 (six) hours as needed for mild pain.   40 tablet   1   . oxyCODONE-acetaminophen (PERCOCET/ROXICET) 5-325 MG per tablet   Oral   Take 1-2 tablets by mouth every 4 (four) hours as needed (for pain scale 4-7).   35 tablet   0   . Prenatal Vit-Fe Fumarate-FA (MULTIVITAMIN-PRENATAL) 27-0.8 MG TABS tablet   Oral   Take 1 tablet by mouth daily at 12 noon.           Allergies Review of patient's allergies indicates no known allergies.  Family History  Problem Relation Age of Onset  . Bipolar disorder Mother   . Heart failure Maternal Grandmother     has pacemaker  . Lung cancer Paternal Grandmother     Social History Social History  Substance Use Topics  . Smoking status: Former Smoker    Types: Cigarettes  . Smokeless tobacco: Never Used  . Alcohol Use: No    Review of Systems Constitutional: No fever/chills Eyes: No visual  changes. ENT: No sore throat. Cardiovascular: Denies chest pain. Respiratory: Denies shortness of breath. Gastrointestinal: No abdominal pain.  No nausea, no vomiting.  No diarrhea.  No constipation. Genitourinary: Negative for dysuria. Musculoskeletal: Negative for back pain. Skin: Negative for rash. Neurological: Negative for headaches, focal weakness or numbness.  10-point ROS otherwise negative.  ____________________________________________   PHYSICAL EXAM:  VITAL SIGNS: ED Triage Vitals  Enc Vitals Group     BP --       Pulse --      Resp --      Temp --      Temp src --      SpO2 --      Weight --      Height --      Head Cir --      Peak Flow --      Pain Score --      Pain Loc --      Pain Edu? --      Excl. in GC? --     Constitutional: Alert and oriented. Patient is tearful and anxious. Eyes: Conjunctivae are normal. PERRL. EOMI. Head: Atraumatic. Nose: No congestion/rhinnorhea. Mouth/Throat: Mucous membranes are moist.   Neck: No stridor.   Cardiovascular: Normal rate, regular rhythm. Grossly normal heart sounds.  Respiratory: Normal respiratory effort.  No retractions. Lungs CTAB. Gastrointestinal: Soft and nontender. No distention. Musculoskeletal: No lower extremity tenderness nor edema.  No joint effusions. Neurologic:  Normal speech and language. No gross focal neurologic deficits are appreciated.  Skin:  Skin is warm, dry and intact. No rash noted. Psychiatric: Mood and affect are normal. Speech and behavior are normal.  ____________________________________________   LABS (all labs ordered are listed, but only abnormal results are displayed)  Labs Reviewed  URINALYSIS COMPLETEWITH MICROSCOPIC ([facility] ONLY) - Abnormal; Notable for the following:    Color, Urine YELLOW (*)    APPearance CLEAR (*)    Ketones, ur 1+ (*)    Protein, ur 100 (*)    Squamous Epithelial / LPF 0-5 (*)    All other components within normal limits  URINE DRUG SCREEN, QUALITATIVE ([facility] ONLY) - Abnormal; Notable for the following:    Cannabinoid 50 Ng, Ur [place] POSITIVE (*)    Benzodiazepine, Ur Scrn POSITIVE (*)    All other components within normal limits  ACETAMINOPHEN LEVEL - Abnormal; Notable for the following:    Acetaminophen (Tylenol), Serum <10 (*)    All other components within normal limits  COMPREHENSIVE METABOLIC PANEL - Abnormal; Notable for the following:    CO2 20 (*)    All other components within normal limits  CBC WITH DIFFERENTIAL/PLATELET - Abnormal; Notable for the following:     Hemoglobin 11.3 (*)    HCT 34.2 (*)    RDW 16.3 (*)    All other components within normal limits  SALICYLATE LEVEL  ETHANOL  CBC WITH DIFFERENTIAL/PLATELET  POC URINE PREG, ED  POCT PREGNANCY, URINE   ____________________________________________  EKG   ____________________________________________  RADIOLOGY   ____________________________________________   PROCEDURES   ____________________________________________   INITIAL IMPRESSION / ASSESSMENT AND PLAN / ED COURSE  Pertinent labs & imaging results that were available during my care of the patient were reviewed by me and considered in my medical decision making (see chart for details).  ----------------------------------------- 12:09 AM on 07/03/2015 -----------------------------------------  Patient on Bactrim baseline. Is calm, collected and cooperative. She said that the last seizure she had was 2-3 months ago. I discussed with  Dr. Thad Rangereynolds of neurology who recommended Keppra load and then discharging with 500 mg twice a day of Keppra. I will also give the patient follow-up with the neurologist as an outpatient. She was instructed to call for an appointment and understands this plan and is willing to comply. She is also requesting help with her psychiatric issues including anxiety and insomnia. The patient denied any suicidal or homicidal ideation. I will also give her [facility] follow-up. She understands the plan for her Keppra load and then to be discharged home. ____________________________________________   FINAL CLINICAL IMPRESSION(S) / ED DIAGNOSES  Seizure.    Myrna Blazeravid Matthew Schaevitz, MD 07/03/15 0010  We also discussed not driving until she is cleared to resume by the neurologist. The patient understands and says she will not be driving.  Myrna Blazeravid Matthew Schaevitz, MD 07/03/15 [id number]

## 2015-07-02 NOTE — ED Notes (Signed)
Ems brought pt in for having seizures today after [employer] took pt children from - per family she has a history of having seizures when she is under severe distress

## 2015-07-02 NOTE — ED Notes (Addendum)
Unable to scan pt or chart stickers to make labels for lab redraws. Called lab and informed that i had to use white chart labels due to error comes up with pt's name stating "in process". Lab states okay to use

## 2015-07-03 MED ORDER — LEVETIRACETAM 500 MG PO TABS: 500.0000 mg | ORAL_TABLET | Freq: Two times a day (BID) | ORAL | Status: DC

## 2015-07-03 MED ORDER — SODIUM CHLORIDE 0.9 % IV SOLN
1000.0000 mg | Freq: Once | INTRAVENOUS | Status: AC
  Administered 2015-07-03: 1000 mg via INTRAVENOUS
  Filled 2015-07-03: qty 10

## 2015-07-03 NOTE — Discharge Instructions (Signed)
Generalized Anxiety Disorder Generalized anxiety disorder (GAD) is a mental disorder. It interferes with life functions, including relationships, work, and school. GAD is different from normal anxiety, which everyone experiences at some point in their lives in response to specific life events and activities. Normal anxiety actually helps us prepare for and get through these life events and activities. Normal anxiety goes away after the event or activity is over.  GAD causes anxiety that is not necessarily related to specific events or activities. It also causes excess anxiety in proportion to specific events or activities. The anxiety associated with GAD is also difficult to control. GAD can vary from mild to severe. People with severe GAD can have intense waves of anxiety with physical symptoms (panic attacks).  SYMPTOMS The anxiety and worry associated with GAD are difficult to control. This anxiety and worry are related to many life events and activities and also occur more days than not for 6 months or longer. People with GAD also have three or more of the following symptoms (one or more in children):  Restlessness.   Fatigue.  Difficulty concentrating.   Irritability.  Muscle tension.  Difficulty sleeping or unsatisfying sleep. DIAGNOSIS GAD is diagnosed through an assessment by your health care provider. Your health care provider will ask you questions aboutyour mood,physical symptoms, and events in your life. Your health care provider may ask you about your medical history and use of alcohol or drugs, including prescription medicines. Your health care provider may also do a physical exam and blood tests. Certain medical conditions and the use of certain substances can cause symptoms similar to those associated with GAD. Your health care provider may refer you to a mental health specialist for further evaluation. TREATMENT The following therapies are usually used to treat GAD:    Medication. Antidepressant medication usually is prescribed for long-term daily control. Antianxiety medicines may be added in severe cases, especially when panic attacks occur.   Talk therapy (psychotherapy). Certain types of talk therapy can be helpful in treating GAD by providing support, education, and guidance. A form of talk therapy called cognitive behavioral therapy can teach you healthy ways to think about and react to daily life events and activities.  Stress managementtechniques. These include yoga, meditation, and exercise and can be very helpful when they are practiced regularly. A mental health specialist can help determine which treatment is best for you. Some people see improvement with one therapy. However, other people require a combination of therapies.   This information is not intended to replace advice given to you by your health care provider. Make sure you discuss any questions you have with your health care provider.   Document Released: 06/25/2012 Document Revised: 03/21/2014 Document Reviewed: 06/25/2012 Elsevier Interactive Patient Education [employer]2016 [employer] Inc.  Seizure, Adult A seizure is abnormal electrical activity in the brain. Seizures usually last from 30 seconds to 2 minutes. There are various types of seizures. Before a seizure, you may have a warning sensation (aura) that a seizure is about to occur. An aura may include the following symptoms:   Fear or anxiety.  Nausea.  Feeling like the room is spinning (vertigo).  Vision changes, such as seeing flashing lights or spots. Common symptoms during a seizure include:  A change in attention or behavior (altered mental status).  Convulsions with rhythmic jerking movements.  Drooling.  Rapid eye movements.  Grunting.  Loss of bladder and bowel control.  Bitter taste in the mouth.  Tongue biting. After a seizure,  you may feel confused and sleepy. You may also have an injury resulting from  convulsions during the seizure. HOME CARE INSTRUCTIONS   If you are given medicines, take them exactly as prescribed by your health care provider.  Keep all follow-up appointments as directed by your health care provider.  Do not swim or drive or engage in risky activity during which a seizure could cause further injury to you or others until your health care provider says it is OK.  Get adequate rest.  Teach friends and family what to do if you have a seizure. They should:  Lay you on the ground to prevent a fall.  Put a cushion under your head.  Loosen any tight clothing around your neck.  Turn you on your side. If vomiting occurs, this helps keep your airway clear.  Stay with you until you recover.  Know whether or not you need emergency care. SEEK IMMEDIATE MEDICAL CARE IF:  The seizure lasts longer than 5 minutes.  The seizure is severe or you do not wake up immediately after the seizure.  You have an altered mental status after the seizure.  You are having more frequent or worsening seizures. Someone should drive you to the emergency department or call local emergency services (911 in [place] MAKE SURE YOU:  Understand these instructions.  Will watch your condition.  Will get help right away if you are not doing well or get worse.   This information is not intended to replace advice given to you by your health care provider. Make sure you discuss any questions you have with your health care provider.   Document Released: 02/26/2000 Document Revised: 03/21/2014 Document Reviewed: 10/10/2012 Elsevier Interactive Patient Education [employer]

## 2015-07-03 NOTE — ED Notes (Signed)
Called pharmacy because had not received Keppra for IV dosage - they stated that they would send it to the unit soon

## 2015-07-03 NOTE — ED Notes (Signed)
Reviewed d/c instructions, follow-up care, prescriptions with pt and pt's family member. Pt and family verbalized understanding. Brought pt pants to wear home

## 2015-07-13 ENCOUNTER — Encounter: Payer: Self-pay | Admitting: *Deleted

## 2015-07-13 ENCOUNTER — Emergency Department
Admission: EM | Admit: 2015-07-13 | Discharge: 2015-07-13 | Disposition: A | Discharge status: Home / Self Care | Payer: Medicaid Other | Attending: Emergency Medicine | Admitting: Emergency Medicine

## 2015-07-13 DIAGNOSIS — R461 Bizarre personal appearance: Secondary | ICD-10-CM | POA: Diagnosis not present

## 2015-07-13 DIAGNOSIS — E669 Obesity, unspecified: Secondary | ICD-10-CM | POA: Insufficient documentation

## 2015-07-13 DIAGNOSIS — Z8669 Personal history of other diseases of the nervous system and sense organs: Secondary | ICD-10-CM | POA: Insufficient documentation

## 2015-07-13 DIAGNOSIS — R569 Unspecified convulsions: Secondary | ICD-10-CM | POA: Diagnosis present

## 2015-07-13 DIAGNOSIS — E119 Type 2 diabetes mellitus without complications: Secondary | ICD-10-CM | POA: Diagnosis not present

## 2015-07-13 DIAGNOSIS — I10 Essential (primary) hypertension: Secondary | ICD-10-CM | POA: Insufficient documentation

## 2015-07-13 DIAGNOSIS — Z87891 Personal history of nicotine dependence: Secondary | ICD-10-CM | POA: Insufficient documentation

## 2015-07-13 LAB — GLUCOSE, CAPILLARY: Glucose-Capillary: 108 mg/dL — ABNORMAL HIGH (ref 65–99)

## 2015-07-13 MED ORDER — ACETAMINOPHEN 500 MG PO TABS
1000.0000 mg | ORAL_TABLET | Freq: Once | ORAL | Status: AC
  Administered 2015-07-13: 1000 mg via ORAL
  Filled 2015-07-13: qty 2

## 2015-07-13 NOTE — ED Notes (Signed)
Pt sat up in bed, started screaming, ripped monitor cords off, pt walked into hall screaming "help, I am here against my will, help", pt brought back to room by [employer], pt screaming my chest hurts, pt put on monitor, MD aware

## 2015-07-13 NOTE — Discharge Instructions (Signed)
You have been seen in the emergency department today following a seizure.  You preferred to not be here and have any additional workup, which we understand, and we respect your wishes.  Please follow up with your doctor/neurologist as soon as possible regarding today's emergency department visit and your seizure-like activity, and take the medication (Keppra) which you were prescribed previously.  As we have discussed it is very important that you do not drive until you have been seen and cleared by your neurologist.  Please drink plenty of fluids, get plenty of sleep and avoid any alcohol or drug use  Please return to the emergency department if you have any further seizures which do not respond to medications, or for any other symptoms per se concerning for yourself.   Nonepileptic Seizures Nonepileptic seizures are seizures that are not caused by abnormal electrical signals in your brain. These seizures often seem like epileptic seizures, but they are not caused by epilepsy.  There are two types of nonepileptic seizures:  A physiologic nonepileptic seizure results from a disruption in your brain.  A psychogenic seizure results from emotional stress. These seizures are sometimes called pseudoseizures. CAUSES  Causes of physiologic nonepileptic seizures include:   Sudden drop in blood pressure.  Low blood sugar.  Low levels of salt (sodium) in your blood.  Low levels of calcium in your blood.  Migraine.  Heart rhythm problems.  Sleep disorders.  Drug and alcohol abuse. Common causes of psychogenic nonepileptic seizures include:  Stress.  Emotional trauma.  Sexual or physical abuse.  Major life events, such as divorce or the death of a loved one.  Mental health disorders, including panic attack and hyperactivity disorder. SIGNS AND SYMPTOMS A nonepileptic seizure can look like an epileptic seizure, including uncontrollable shaking (convulsions), or changes in attention,  behavior, or the ability to remain awake and alert. However, there are some differences. Nonepileptic seizures usually:  Do not cause physical injuries.  Start slowly.  Include crying or shrieking.  Last longer than 2 minutes.  Have a short recovery time without headache or exhaustion. DIAGNOSIS  Your health care provider can usually diagnose nonepileptic seizures after taking your medical history and giving you a physical exam. Your health care provider may want to talk to your friends or relatives who have seen you have a seizure.  You may also need to have tests to look for causes of physiologic nonepileptic seizures. This may include an electroencephalogram (EEG), which is a test that measures electrical activity in your brain. If you have had an epileptic seizure, the results of your EEG will be abnormal. If your health care provider thinks you have had a psychogenic nonepileptic seizure, you may need to see a mental health specialist for an evaluation. TREATMENT  Treatment depends on the type and cause of your seizures.  For physiologic nonepileptic seizures, treatment is aimed at addressing the underlying condition that caused the seizures. These seizures usually stop when the underlying condition is properly treated.  Nonepileptic seizures do not respond to the seizure medicines used to treat epilepsy.  For psychogenic seizures, you may need to work with a mental health specialist. HOME CARE INSTRUCTIONS Home care will depend on the type of nonepileptic seizures you have.   Follow all your health care provider's instructions.  Keep all your follow-up appointments. SEEK MEDICAL CARE IF: You continue to have seizures after treatment. SEEK IMMEDIATE MEDICAL CARE IF:  Your seizures change or become more frequent.  You injure yourself during  a seizure.  You have one seizure after another.  You have trouble recovering from a seizure.  You have chest pain or trouble  breathing. MAKE SURE YOU:  Understand these instructions.  Will watch your condition.  Will get help right away if you are not doing well or get worse.   This information is not intended to replace advice given to you by your health care provider. Make sure you discuss any questions you have with your health care provider.   Document Released: 04/15/2005 Document Revised: 03/21/2014 Document Reviewed: 12/25/2012 Elsevier Interactive Patient Education [employer] Inc.   Seizure, Adult A seizure means there is unusual activity in the brain. A seizure can cause changes in attention or behavior. Seizures often cause shaking (convulsions). Seizures often last from 30 seconds to 2 minutes. HOME CARE   If you are given medicines, take them exactly as told by your doctor.  Keep all doctor visits as told.  Do not swim or drive until your doctor says it is okay.  Teach others what to do if you have a seizure. They should:  Lay you on the ground.  Put a cushion under your head.  Loosen any tight clothing around your neck.  Turn you on your side.  Stay with you until you get better. GET HELP RIGHT AWAY IF:   The seizure lasts longer than 2 to 5 minutes.  The seizure is very bad.  The person does not wake up after the seizure.  The person's attention or behavior changes. Drive the person to the emergency room or call your local emergency services (911 in [place] MAKE SURE YOU:   Understand these instructions.  Will watch your condition.  Will get help right away if you are not doing well or get worse.   This information is not intended to replace advice given to you by your health care provider. Make sure you discuss any questions you have with your health care provider.   Document Released: 08/17/2007 Document Revised: 05/23/2011 Document Reviewed: 10/10/2012 Elsevier Interactive Patient Education [employer]

## 2015-07-13 NOTE — ED Notes (Signed)
EDP at bedside  

## 2015-07-13 NOTE — ED Notes (Signed)
Pt arrives via EMS from home with a witnessed seizure by her neighbor, fire dept states postitical upon their arrival, upon arrival pt screaming and hyperventalating saying "I dont want to be here, I just wanted to rest"

## 2015-07-13 NOTE — ED Provider Notes (Signed)
[facility] Emergency Department Provider Note  ____________________________________________  Time seen: Approximately 6:47 PM  I have reviewed the triage vital signs and the nursing notes.   HISTORY  Chief Complaint Seizures  History limited by cooperation of patient  HPI [name] is a 25 y.o. female with a documented PMH of nonspecific seizure disorder who presents by EMS for evaluation of seizure like activity.  The history is severely limited due to patient cooperation, but reportedly she was lying down in her yard and EMS was called by the neighbor.  The patient was acting post-ictal (or at least unusual) by EMS.  As they arrived in the ED, the patient became wildly combative, yelling and screaming and attempting to pull out her IV and her leads, and screaming that she is being held against her will. She had to be redirected by police and nursing staff.  When I evaluated her she was hyperventilating but calmed down to firm but gentle voice.  She reports that she was just sleepy and just wanted to rest and that she never had a seizure.  She admits that she does not take her prescribed seizure medication, specifically the Keppra prescribed during a very similar ED visit about 10 days ago.    She currently has no complaints except for being sleepy and wanting to rest and stating that her whole body is sore.  She did not bite her tongue.  She reportedly urinated on herself, but her pants appear dry at this time.  She denies headache, SOB (in spite of hyperventilating), CP, abd pain, N/V/D. Her symptoms appear acute in onset and severe.   Past Medical History  Diagnosis Date  . IUGR (intrauterine growth restriction) in prior pregnancy, pregnant   . GBS bacteriuria   . Obesity (BMI 35.0-39.9 without comorbidity) (HCC)   . Hx of preeclampsia, prior pregnancy, currently pregnant   . Depression   . Hypertension   . Diabetes mellitus without complication  (HCC)   . Seizures [facility](HCC)     Patient Active Problem List   Diagnosis Date Noted  . S/P cesarean section 09/04/2014  . GDM (gestational diabetes mellitus) 09/03/2014  . Gestational diabetes mellitus in third trimester 09/02/2014  . Antepartum mild preeclampsia 09/02/2014  . Mild preeclampsia 08/26/2014  . Pregnant 08/25/2014  . Elevated blood pressure affecting pregnancy in third trimester, antepartum 08/25/2014  . Indication for care in labor and delivery, antepartum 07/14/2014    Past Surgical History  Procedure Laterality Date  . Cesarean section    . Cholecystectomy, laparoscopic    . Cesarean section N/A 09/03/2014    Procedure: CESAREAN SECTION;  Surgeon: Nadara Mustardobert P Harris, MD;  Location: [facility] ORS;  Service: Obstetrics;  Laterality: N/A;    Current Outpatient Rx  Name  Route  Sig  Dispense  Refill  . ibuprofen (ADVIL,MOTRIN) 600 MG tablet   Oral   Take 1 tablet (600 mg total) by mouth every 6 (six) hours as needed for mild pain.   40 tablet   1   . levETIRAcetam (KEPPRA) 500 MG tablet   Oral   Take 1 tablet (500 mg total) by mouth 2 (two) times daily.   60 tablet   0   . oxyCODONE-acetaminophen (PERCOCET/ROXICET) 5-325 MG per tablet   Oral   Take 1-2 tablets by mouth every 4 (four) hours as needed (for pain scale 4-7).   35 tablet   0   . Prenatal Vit-Fe Fumarate-FA (MULTIVITAMIN-PRENATAL) 27-0.8 MG TABS tablet  Oral   Take 1 tablet by mouth daily at 12 noon.           Allergies Review of patient's allergies indicates no known allergies.  Family History  Problem Relation Age of Onset  . Bipolar disorder Mother   . Heart failure Maternal Grandmother     has pacemaker  . Lung cancer Paternal Grandmother     Social History Social History  Substance Use Topics  . Smoking status: Former Smoker    Types: Cigarettes  . Smokeless tobacco: Never Used  . Alcohol Use: No    Review of Systems Constitutional: No fever/chills Eyes: No visual  changes. ENT: No sore throat. Cardiovascular: Denies chest pain. Respiratory: Denies shortness of breath. Gastrointestinal: No abdominal pain.  No nausea, no vomiting.  No diarrhea.  No constipation. Genitourinary: Negative for dysuria. Musculoskeletal: Says her body hurts all over. Skin: Negative for rash. Neurological: Negative for headaches, focal weakness or numbness.  Reportedy seizure like activity though no one specifically witnessed it.  10-point ROS otherwise negative.  ____________________________________________   PHYSICAL EXAM:  VITAL SIGNS: ED Triage Vitals  Enc Vitals Group     BP 07/13/15 1803 161/100 mmHg     Pulse Rate 07/13/15 1803 106     Resp 07/13/15 1803 18     Temp 07/13/15 1803 98.7 F (37.1 C)     Temp Source 07/13/15 1803 Oral     SpO2 07/13/15 1803 100 %     Weight 07/13/15 1803 178 lb (80.74 kg)     Height 07/13/15 1803 5\' 5"  (1.651 m)     Head Cir --      Peak Flow --      Pain Score --      Pain Loc --      Pain Edu? --      Excl. in GC? --     Constitutional: Alert but combative, hyperventilating, making little sense initially, uncooperative but in no respiratory or other (medical) distress Eyes: Conjunctivae are normal. PERRL. EOMI. Head: Atraumatic. Nose: No congestion/rhinnorhea. Mouth/Throat: Mucous membranes are moist.  Oropharynx non-erythematous. Neck: No stridor.  No meningeal signs.  No cervical spine tenderness to palpation. Cardiovascular: Normal rate, regular rhythm. Good peripheral circulation. Grossly normal heart sounds.   Respiratory: Normal respiratory effort.  No retractions. Lungs CTAB. Gastrointestinal: Soft and nontender. No distention.  Musculoskeletal: No lower extremity tenderness nor edema. No gross deformities of extremities. Neurologic:  Normal speech and language. No gross focal neurologic deficits are appreciated. Moving all four limbs without difficulty or impairment Skin:  Skin is warm, dry and intact. No  rash noted. Psychiatric: Odd affect, hyperventilating, claiming she is being held against her will, bizarre behavior  ____________________________________________   LABS (all labs ordered are listed, but only abnormal results are displayed)  Labs Reviewed  GLUCOSE, CAPILLARY - Abnormal; Notable for the following:    Glucose-Capillary 108 (*)    All other components within normal limits   ____________________________________________  EKG  None ____________________________________________  RADIOLOGY   No results found.  ____________________________________________   PROCEDURES  Procedure(s) performed: None  Critical Care performed: No ____________________________________________   INITIAL IMPRESSION / ASSESSMENT AND PLAN / ED COURSE  Pertinent labs & imaging results that were available during my care of the patient were reviewed by me and considered in my medical decision making (see chart for details).  When I initially say the patient, I got her to calm down with gentle but firm instructions.  I was called  away for another critical patient.  When I returned, her fiancee was present.  He reports that she has seizures "when she gets stressed" and that she has been under a lot of stress and pressure lately.  Reportedly there are some child custody issues that have her upset.  She also had a disagreement with her fiancee, and the seizure-like activity occurred shortly thereafter.  He witnessed her "shaking" some, but reports that she never lost consciousness in front of him, but "this is the way she acts when she seizes."  The history, physical exam, and behavior I witnessed in the ED strongly suggest psychogenic, non-epileptiform seizure-like episodes.  Nothing about her current presentation suggests a post-ictal state, and she has no signs of injury.  Furthermore, she is consistently non-adherent to her medical regimen, and admits that she purposely missed going to see Dr.  Sherryll Burger for neurology follow up "a long time ago" (well before her ED visit 10 days ago).   I explained that she needs to take her medications to see if they help, and she needs to follow up with neurology, but since she does not want to be in the ED and does not want to be treated, I think it is appropriate to send her home.  She and her fiancee agree with this plan.  I gave my usual and customary return precautions, including advising her that she should not drive until seen and cleared by neurology.   ____________________________________________  FINAL CLINICAL IMPRESSION(S) / ED DIAGNOSES  Final diagnoses:  Seizure-like activity (HCC)     MEDICATIONS GIVEN AND/OR PRESCRIBED DURING THIS VISIT:  Medications  acetaminophen (TYLENOL) tablet 1,000 mg (1,000 mg Oral Given 07/13/15 1945)     NEW OUTPATIENT MEDICATIONS STARTED DURING THIS VISIT:  Discharge Medication List as of 07/13/2015  7:32 PM        Note:  This document was prepared using Dragon voice recognition software and may include unintentional dictation errors.   Loleta Rose, MD 07/13/15 2249

## 2015-07-13 NOTE — ED Notes (Addendum)
Pt screaming and crying, states she hit her head, states she does not want to be here, pt trying to remove her clothes, attempted to try to calm pt down, encouraged deep breathing, states she has not been taking her meds because she does not like the side effects

## 2015-10-29 ENCOUNTER — Emergency Department
Admission: EM | Admit: 2015-10-29 | Discharge: 2015-10-29 | Disposition: A | Discharge status: Home / Self Care | Payer: Medicaid Other | Attending: Student in an Organized Health Care Education/Training Program | Admitting: Student in an Organized Health Care Education/Training Program

## 2015-10-29 ENCOUNTER — Emergency Department: Payer: Medicaid Other

## 2015-10-29 ENCOUNTER — Encounter: Payer: Self-pay | Admitting: Emergency Medicine

## 2015-10-29 DIAGNOSIS — E119 Type 2 diabetes mellitus without complications: Secondary | ICD-10-CM | POA: Insufficient documentation

## 2015-10-29 DIAGNOSIS — I10 Essential (primary) hypertension: Secondary | ICD-10-CM | POA: Insufficient documentation

## 2015-10-29 DIAGNOSIS — M25562 Pain in left knee: Secondary | ICD-10-CM

## 2015-10-29 DIAGNOSIS — Z87891 Personal history of nicotine dependence: Secondary | ICD-10-CM | POA: Insufficient documentation

## 2015-10-29 MED ORDER — OXYCODONE-ACETAMINOPHEN 5-325 MG PO TABS: 1.0000 | ORAL_TABLET | Freq: Three times a day (TID) | ORAL | 0 refills | Status: DC | PRN

## 2015-10-29 MED ORDER — MELOXICAM 15 MG PO TABS: 15.0000 mg | ORAL_TABLET | Freq: Every day | ORAL | 0 refills | Status: DC | PRN

## 2015-10-29 MED ORDER — KETOROLAC TROMETHAMINE 60 MG/2ML IM SOLN
60.0000 mg | Freq: Once | INTRAMUSCULAR | Status: AC
  Administered 2015-10-29: 60 mg via INTRAMUSCULAR
  Filled 2015-10-29: qty 2

## 2015-10-29 NOTE — Discharge Instructions (Signed)
Take medication as prescribed. Rest. Apply ice and elevate. Use crutches and knee immobilizer as long as pain continues  Follow up with orthopedic as discussed for continued pain.   Follow up with your primary care physician this week as needed. Return to Urgent care for new or worsening concerns.

## 2015-10-29 NOTE — ED Triage Notes (Signed)
Patient states slipped when stepping off a bus and injured left knee.  Injury occurred about one hour ago.
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
## 2016-02-18 ENCOUNTER — Emergency Department: Payer: Self-pay

## 2016-02-18 ENCOUNTER — Emergency Department
Admission: EM | Admit: 2016-02-18 | Discharge: 2016-02-18 | Disposition: A | Discharge status: Home / Self Care | Payer: Self-pay | Attending: Emergency Medicine | Admitting: Emergency Medicine

## 2016-02-18 DIAGNOSIS — G40909 Epilepsy, unspecified, not intractable, without status epilepticus: Secondary | ICD-10-CM | POA: Insufficient documentation

## 2016-02-18 DIAGNOSIS — Z9114 Patient's other noncompliance with medication regimen: Secondary | ICD-10-CM | POA: Insufficient documentation

## 2016-02-18 DIAGNOSIS — S5011XA Contusion of right forearm, initial encounter: Secondary | ICD-10-CM | POA: Insufficient documentation

## 2016-02-18 DIAGNOSIS — S8002XA Contusion of left knee, initial encounter: Secondary | ICD-10-CM | POA: Insufficient documentation

## 2016-02-18 DIAGNOSIS — R569 Unspecified convulsions: Secondary | ICD-10-CM

## 2016-02-18 DIAGNOSIS — T7491XA Unspecified adult maltreatment, confirmed, initial encounter: Secondary | ICD-10-CM

## 2016-02-18 DIAGNOSIS — I10 Essential (primary) hypertension: Secondary | ICD-10-CM | POA: Insufficient documentation

## 2016-02-18 DIAGNOSIS — Z79899 Other long term (current) drug therapy: Secondary | ICD-10-CM | POA: Insufficient documentation

## 2016-02-18 DIAGNOSIS — S5012XA Contusion of left forearm, initial encounter: Secondary | ICD-10-CM | POA: Insufficient documentation

## 2016-02-18 DIAGNOSIS — Y999 Unspecified external cause status: Secondary | ICD-10-CM | POA: Insufficient documentation

## 2016-02-18 DIAGNOSIS — Y939 Activity, unspecified: Secondary | ICD-10-CM | POA: Insufficient documentation

## 2016-02-18 DIAGNOSIS — Y929 Unspecified place or not applicable: Secondary | ICD-10-CM | POA: Insufficient documentation

## 2016-02-18 DIAGNOSIS — Z791 Long term (current) use of non-steroidal anti-inflammatories (NSAID): Secondary | ICD-10-CM | POA: Insufficient documentation

## 2016-02-18 DIAGNOSIS — Z87891 Personal history of nicotine dependence: Secondary | ICD-10-CM | POA: Insufficient documentation

## 2016-02-18 DIAGNOSIS — E119 Type 2 diabetes mellitus without complications: Secondary | ICD-10-CM | POA: Insufficient documentation

## 2016-02-18 LAB — URINALYSIS, COMPLETE (UACMP) WITH MICROSCOPIC
BACTERIA UA: NONE SEEN
BILIRUBIN URINE: NEGATIVE
Glucose, UA: NEGATIVE mg/dL
KETONES UR: 5 mg/dL — AB
Leukocytes, UA: NEGATIVE
Nitrite: NEGATIVE
PH: 5 (ref 5.0–8.0)
Protein, ur: 100 mg/dL — AB
SPECIFIC GRAVITY, URINE: 1.027 (ref 1.005–1.030)

## 2016-02-18 LAB — BASIC METABOLIC PANEL
Anion gap: 12 (ref 5–15)
BUN: 16 mg/dL (ref 6–20)
CALCIUM: 10.2 mg/dL (ref 8.9–10.3)
CO2: 22 mmol/L (ref 22–32)
Chloride: 106 mmol/L (ref 101–111)
Creatinine, Ser: 0.89 mg/dL (ref 0.44–1.00)
GFR calc Af Amer: >60 mL/min (ref >60)
GLUCOSE: 88 mg/dL (ref 65–99)
Potassium: 3.8 mmol/L (ref 3.5–5.1)
Sodium: 140 mmol/L (ref 135–145)

## 2016-02-18 LAB — CBC WITH DIFFERENTIAL/PLATELET
Basophils Absolute: 0 10*3/uL (ref 0–0.1)
Basophils Relative: 0 %
EOS PCT: 0 %
Eosinophils Absolute: 0 10*3/uL (ref 0–0.7)
HEMATOCRIT: 36.2 % (ref 35.0–47.0)
Hemoglobin: 11.9 g/dL — ABNORMAL LOW (ref 12.0–16.0)
LYMPHS ABS: 1.8 10*3/uL (ref 1.0–3.6)
LYMPHS PCT: 12 %
MCH: 26.9 pg (ref 26.0–34.0)
MCHC: 32.9 g/dL (ref 32.0–36.0)
MCV: 81.9 fL (ref 80.0–100.0)
MONO ABS: 0.6 10*3/uL (ref 0.2–0.9)
MONOS PCT: 4 %
Neutro Abs: 13.2 10*3/uL — ABNORMAL HIGH (ref 1.4–6.5)
Neutrophils Relative %: 84 %
PLATELETS: 331 10*3/uL (ref 150–440)
RBC: 4.43 MIL/uL (ref 3.80–5.20)
RDW: 17 % — AB (ref 11.5–14.5)
WBC: 15.7 10*3/uL — ABNORMAL HIGH (ref 3.6–11.0)

## 2016-02-18 LAB — POCT PREGNANCY, URINE: PREG TEST UR: NEGATIVE

## 2016-02-18 MED ORDER — SODIUM CHLORIDE 0.9 % IV SOLN
1000.0000 mg | Freq: Once | INTRAVENOUS | Status: AC
  Administered 2016-02-18: 1000 mg via INTRAVENOUS
  Filled 2016-02-18: qty 10

## 2016-02-18 MED ORDER — LEVETIRACETAM 500 MG PO TABS: 500.0000 mg | ORAL_TABLET | Freq: Two times a day (BID) | ORAL | 0 refills | Status: DC

## 2016-02-18 MED ORDER — ACETAMINOPHEN 325 MG PO TABS
ORAL_TABLET | ORAL | Status: AC
  Administered 2016-02-18: 650 mg via ORAL
  Filled 2016-02-18: qty 2

## 2016-02-18 MED ORDER — ACETAMINOPHEN 325 MG PO TABS
650.0000 mg | ORAL_TABLET | Freq: Once | ORAL | Status: AC
  Administered 2016-02-18: 650 mg via ORAL
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
## 2016-02-18 NOTE — ED Notes (Signed)
Pt educated on importance of taking seizure medication and finding a PCP, pt given resources for medication assistance and open-door clinics. Pt verbalized understanding.

## 2016-02-18 NOTE — ED Triage Notes (Signed)
Pt from home via EMS, per family pt had seizure like activity, pt postictal upon EMS arrival. Pt lethargic and refusing to answer nurses questions during triage
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
## 2016-07-22 DIAGNOSIS — E119 Type 2 diabetes mellitus without complications: Secondary | ICD-10-CM | POA: Insufficient documentation

## 2016-07-22 DIAGNOSIS — I10 Essential (primary) hypertension: Secondary | ICD-10-CM | POA: Insufficient documentation

## 2016-07-22 DIAGNOSIS — G40909 Epilepsy, unspecified, not intractable, without status epilepticus: Secondary | ICD-10-CM | POA: Insufficient documentation

## 2016-07-22 DIAGNOSIS — Z87891 Personal history of nicotine dependence: Secondary | ICD-10-CM | POA: Insufficient documentation

## 2016-07-22 DIAGNOSIS — R1084 Generalized abdominal pain: Secondary | ICD-10-CM | POA: Insufficient documentation

## 2016-07-22 DIAGNOSIS — R102 Pelvic and perineal pain: Secondary | ICD-10-CM | POA: Insufficient documentation

## 2016-07-22 DIAGNOSIS — Z79899 Other long term (current) drug therapy: Secondary | ICD-10-CM | POA: Insufficient documentation

## 2016-07-23 ENCOUNTER — Encounter: Payer: Self-pay | Admitting: Emergency Medicine

## 2016-07-23 ENCOUNTER — Emergency Department: Payer: Medicaid Other

## 2016-07-23 ENCOUNTER — Emergency Department
Admission: EM | Admit: 2016-07-23 | Discharge: 2016-07-23 | Disposition: A | Discharge status: Home / Self Care | Payer: Medicaid Other | Attending: Emergency Medicine | Admitting: Emergency Medicine

## 2016-07-23 DIAGNOSIS — R102 Pelvic and perineal pain: Secondary | ICD-10-CM

## 2016-07-23 DIAGNOSIS — R1084 Generalized abdominal pain: Secondary | ICD-10-CM

## 2016-07-23 DIAGNOSIS — R569 Unspecified convulsions: Secondary | ICD-10-CM

## 2016-07-23 LAB — COMPREHENSIVE METABOLIC PANEL
ALT: 14 U/L (ref 14–54)
ANION GAP: 8 (ref 5–15)
AST: 27 U/L (ref 15–41)
Albumin: 3.6 g/dL (ref 3.5–5.0)
Alkaline Phosphatase: 52 U/L (ref 38–126)
BILIRUBIN TOTAL: 0.6 mg/dL (ref 0.3–1.2)
BUN: 10 mg/dL (ref 6–20)
CO2: 22 mmol/L (ref 22–32)
Calcium: 8.5 mg/dL — ABNORMAL LOW (ref 8.9–10.3)
Chloride: 105 mmol/L (ref 101–111)
Creatinine, Ser: 0.79 mg/dL (ref 0.44–1.00)
GFR calc non Af Amer: >60 mL/min (ref >60)
Glucose, Bld: 113 mg/dL — ABNORMAL HIGH (ref 65–99)
POTASSIUM: 3.3 mmol/L — AB (ref 3.5–5.1)
Sodium: 135 mmol/L (ref 135–145)
TOTAL PROTEIN: 7.4 g/dL (ref 6.5–8.1)

## 2016-07-23 LAB — CBC WITH DIFFERENTIAL/PLATELET
BASOS ABS: 0 10*3/uL (ref 0–0.1)
Basophils Relative: 0 %
Eosinophils Absolute: 0 10*3/uL (ref 0–0.7)
Eosinophils Relative: 1 %
HCT: 34.5 % — ABNORMAL LOW (ref 35.0–47.0)
Hemoglobin: 12 g/dL (ref 12.0–16.0)
LYMPHS ABS: 1 10*3/uL (ref 1.0–3.6)
LYMPHS PCT: 15 %
MCH: 29.3 pg (ref 26.0–34.0)
MCHC: 34.9 g/dL (ref 32.0–36.0)
MCV: 84.1 fL (ref 80.0–100.0)
MONO ABS: 0.2 10*3/uL (ref 0.2–0.9)
MONOS PCT: 4 %
Neutro Abs: 5.3 10*3/uL (ref 1.4–6.5)
Neutrophils Relative %: 80 %
PLATELETS: 264 10*3/uL (ref 150–440)
RBC: 4.1 MIL/uL (ref 3.80–5.20)
RDW: 16.3 % — AB (ref 11.5–14.5)
WBC: 6.7 10*3/uL (ref 3.6–11.0)

## 2016-07-23 LAB — URINALYSIS, COMPLETE (UACMP) WITH MICROSCOPIC
BILIRUBIN URINE: NEGATIVE
Bacteria, UA: NONE SEEN
GLUCOSE, UA: NEGATIVE mg/dL
Hgb urine dipstick: NEGATIVE
KETONES UR: NEGATIVE mg/dL
Leukocytes, UA: NEGATIVE
Nitrite: NEGATIVE
Protein, ur: 30 mg/dL — AB
Specific Gravity, Urine: 1.026 (ref 1.005–1.030)
pH: 6 (ref 5.0–8.0)

## 2016-07-23 LAB — POCT PREGNANCY, URINE: PREG TEST UR: POSITIVE — AB

## 2016-07-23 LAB — HCG, QUANTITATIVE, PREGNANCY: HCG, BETA CHAIN, QUANT, S: 1954 m[IU]/mL — AB (ref <5)

## 2016-07-23 MED ORDER — SODIUM CHLORIDE 0.9 % IV SOLN
1000.0000 mg | Freq: Once | INTRAVENOUS | Status: AC
  Administered 2016-07-23: 1000 mg via INTRAVENOUS
  Filled 2016-07-23: qty 10

## 2016-07-23 MED ORDER — HYDROCODONE-ACETAMINOPHEN 5-325 MG PO TABS
1.0000 | ORAL_TABLET | Freq: Once | ORAL | Status: AC
  Administered 2016-07-23: 1 via ORAL
  Filled 2016-07-23: qty 1

## 2016-07-23 NOTE — ED Notes (Signed)
No oral trauma or head trauma noted. No extremity trauma noted. Pt with normal color warm and dry skin. Pt is postictal. Pt oriented multiple times by this rn to circumstances surrounding placement in treatment room.

## 2016-07-23 NOTE — ED Notes (Addendum)
[name] from US out to stat desk requesting RN immediately, this nurse came to US room, pt lying prone on the floor, at first not responding when name called.  Pt responsive to physical stimulation, pt non-verbal.  Pt with jerking motions.  Charge nurse called.  Pt turned on her back then onto her left side, head protected by pillow and this nurse's hand.  Pt combative when trying to reposition.  Pt c/o to have jerking motions.  Per epic, pt w/ hx of seizures.  Additional staff called to room and pt lifted onto bed and moved to rm 16.    Per Emily Leashonna, US tech, pt was standing up at foot of bed putting clothes back on after US completed.  [name] report she had her back turned when she heard pt fall, states wheelchair was in front of pt.  Upon initial assessment by this nurse, no obvious injuries.

## 2016-07-23 NOTE — ED Notes (Signed)
Pt up intermittently attempting to get out of bed. Charge rn notified and to send alissa to sit.

## 2016-07-23 NOTE — ED Notes (Signed)
Report to rachel, rn.  

## 2016-07-23 NOTE — ED Triage Notes (Signed)
Pt states that she had an abortion Monday morning and started experiencing lower pelvic pain since around 1800 this evening. Pt is rocking in triage and stating that she is having back, leg pelvic pain and painful urination. Pt is in NAD at this time.

## 2016-07-23 NOTE — Discharge Instructions (Signed)
Please make sure you continue taking her Keppra as prescribed. Take an appointment to follow-up with your OB gynecologist who performed your abortion tomorrow for recheck. Return to the emergency department sooner for any concerns.  It was a pleasure to take care of you today, and thank you for coming to our emergency department.  If you have any questions or concerns before leaving please ask the nurse to grab me and I'm more than happy to go through your aftercare instructions again.  If you were prescribed any opioid pain medication today such as Norco, Vicodin, Percocet, morphine, hydrocodone, or oxycodone please make sure you do not drive when you are taking this medication as it can alter your ability to drive safely.  If you have any concerns once you are home that you are not improving or are in fact getting worse before you can make it to your follow-up appointment, please do not hesitate to call 911 and come back for further evaluation.  Merrily Brittle MD  Results for orders placed or performed during the hospital encounter of 07/23/16  Urinalysis, Complete w Microscopic  Result Value Ref Range   Color, Urine YELLOW (A) YELLOW   APPearance HAZY (A) CLEAR   Specific Gravity, Urine 1.026 1.005 - 1.030   pH 6.0 5.0 - 8.0   Glucose, UA NEGATIVE NEGATIVE mg/dL   Hgb urine dipstick NEGATIVE NEGATIVE   Bilirubin Urine NEGATIVE NEGATIVE   Ketones, ur NEGATIVE NEGATIVE mg/dL   Protein, ur 30 (A) NEGATIVE mg/dL   Nitrite NEGATIVE NEGATIVE   Leukocytes, UA NEGATIVE NEGATIVE   RBC / HPF 0-5 0 - 5 RBC/hpf   WBC, UA 0-5 0 - 5 WBC/hpf   Bacteria, UA NONE SEEN NONE SEEN   Squamous Epithelial / LPF 0-5 (A) NONE SEEN   Mucous PRESENT   CBC with Differential  Result Value Ref Range   WBC 6.7 3.6 - 11.0 K/uL   RBC 4.10 3.80 - 5.20 MIL/uL   Hemoglobin 12.0 12.0 - 16.0 g/dL   HCT 40.9 (L) 81.1 - 91.4 %   MCV 84.1 80.0 - 100.0 fL   MCH 29.3 26.0 - 34.0 pg   MCHC 34.9 32.0 - 36.0 g/dL   RDW  78.2 (H) 95.6 - 14.5 %   Platelets 264 150 - 440 K/uL   Neutrophils Relative % 80 %   Neutro Abs 5.3 1.4 - 6.5 K/uL   Lymphocytes Relative 15 %   Lymphs Abs 1.0 1.0 - 3.6 K/uL   Monocytes Relative 4 %   Monocytes Absolute 0.2 0.2 - 0.9 K/uL   Eosinophils Relative 1 %   Eosinophils Absolute 0.0 0 - 0.7 K/uL   Basophils Relative 0 %   Basophils Absolute 0.0 0 - 0.1 K/uL  Comprehensive metabolic panel  Result Value Ref Range   Sodium 135 135 - 145 mmol/L   Potassium 3.3 (L) 3.5 - 5.1 mmol/L   Chloride 105 101 - 111 mmol/L   CO2 22 22 - 32 mmol/L   Glucose, Bld 113 (H) 65 - 99 mg/dL   BUN 10 6 - 20 mg/dL   Creatinine, Ser 2.13 0.44 - 1.00 mg/dL   Calcium 8.5 (L) 8.9 - 10.3 mg/dL   Total Protein 7.4 6.5 - 8.1 g/dL   Albumin 3.6 3.5 - 5.0 g/dL   AST 27 15 - 41 U/L   ALT 14 14 - 54 U/L   Alkaline Phosphatase 52 38 - 126 U/L   Total Bilirubin 0.6 0.3 - 1.2  mg/dL   GFR calc non Af Amer >60 >60 mL/min   GFR calc Af Amer >60 >60 mL/min   Anion gap 8 5 - 15  hCG, quantitative, pregnancy  Result Value Ref Range   hCG, Beta Chain, Quant, S 1,954 (H) <5 mIU/mL  Pregnancy, urine POC  Result Value Ref Range   Preg Test, Ur POSITIVE (A) NEGATIVE   [place] Transvaginal Non-ob  Result Date: 07/23/2016 CLINICAL DATA:  26 y/o F; mid pelvic pain. History of abortion on Monday. EXAM: TRANSABDOMINAL AND TRANSVAGINAL ULTRASOUND OF PELVIS TECHNIQUE: Both transabdominal and transvaginal ultrasound examinations of the pelvis were performed. Transabdominal technique was performed for global imaging of the pelvis including uterus, ovaries, adnexal regions, and pelvic cul-de-sac. It was necessary to proceed with endovaginal exam following the transabdominal exam to visualize the endometrium and ovaries. COMPARISON:  09/03/2014 pelvic ultrasound FINDINGS: Uterus Measurements: 9.7 x 4.5 x 6.5 cm. No fibroids or other mass visualized. Endometrium Thickness: 7.6 mm.  No focal abnormality visualized. Right ovary  Measurements: 2.7 x 1.6 x 1.4 cm. Normal appearance/no adnexal mass. Left ovary Measurements: 2.4 x 1.5 x 2.3 cm. Normal appearance/no adnexal mass. Other findings No abnormal free fluid. IMPRESSION: No acute process identified. Unremarkable pelvic ultrasound for age. Electronically Signed   By: Mitzi HansenLance  Furusawa-Stratton M.D.   On: 07/23/2016 01:38   [place] Pelvis Complete  Result Date: 07/23/2016 CLINICAL DATA:  26 y/o F; mid pelvic pain. History of abortion on Monday. EXAM: TRANSABDOMINAL AND TRANSVAGINAL ULTRASOUND OF PELVIS TECHNIQUE: Both transabdominal and transvaginal ultrasound examinations of the pelvis were performed. Transabdominal technique was performed for global imaging of the pelvis including uterus, ovaries, adnexal regions, and pelvic cul-de-sac. It was necessary to proceed with endovaginal exam following the transabdominal exam to visualize the endometrium and ovaries. COMPARISON:  09/03/2014 pelvic ultrasound FINDINGS: Uterus Measurements: 9.7 x 4.5 x 6.5 cm. No fibroids or other mass visualized. Endometrium Thickness: 7.6 mm.  No focal abnormality visualized. Right ovary Measurements: 2.7 x 1.6 x 1.4 cm. Normal appearance/no adnexal mass. Left ovary Measurements: 2.4 x 1.5 x 2.3 cm. Normal appearance/no adnexal mass. Other findings No abnormal free fluid. IMPRESSION: No acute process identified. Unremarkable pelvic ultrasound for age. Electronically Signed   By: Mitzi HansenLance  Furusawa-Stratton M.D.   On: 07/23/2016 01:38

## 2016-07-23 NOTE — ED Provider Notes (Signed)
[facility] Emergency Department Provider Note  ____________________________________________   First MD Initiated Contact with Patient 07/23/16 0128     (approximate)  I have reviewed the triage vital signs and the nursing notes.   HISTORY  Chief Complaint Generalized Body Aches and Urinary Tract Infection    HPI [name] is a 26 y.o. female who comes to the emergency department with abdominal pain. Several days ago she had a dilation and curettage done for an elective abortion of a 13 week fetus performed in [place].She says she initially improved but for the past several days has noted cramping lower abdominal discomfort along with persistent vaginal bleeding. She comes to the emergency department today for reevaluation. She does report some dysuria. She denies fevers or chills. She denies nausea or vomiting. She denies chest pain or shortness of breath. Of note she also has a past medical history of seizure disorder for which she takes Keppra 500 mg by mouth twice a day and has been intermittently noncompliant with her Keppra.   Past Medical History:  Diagnosis Date  . Depression   . Diabetes mellitus without complication (HCC)   . GBS bacteriuria   . Hx of preeclampsia, prior pregnancy, currently pregnant   . Hypertension   . IUGR (intrauterine growth restriction) in prior pregnancy, pregnant   . Obesity (BMI 35.0-39.9 without comorbidity)   . Seizures [facility])     Patient Active Problem List   Diagnosis Date Noted  . S/P cesarean section 09/04/2014  . GDM (gestational diabetes mellitus) 09/03/2014  . Gestational diabetes mellitus in third trimester 09/02/2014  . Antepartum mild preeclampsia 09/02/2014  . Mild preeclampsia 08/26/2014  . Pregnant 08/25/2014  . Elevated blood pressure affecting pregnancy in third trimester, antepartum 08/25/2014  . Indication for care in labor and delivery, antepartum 07/14/2014    Past Surgical History:   Procedure Laterality Date  . CESAREAN SECTION    . CESAREAN SECTION N/A 09/03/2014   Procedure: CESAREAN SECTION;  Surgeon: Nadara Mustard, MD;  Location: [facility] ORS;  Service: Obstetrics;  Laterality: N/A;  . CHOLECYSTECTOMY, LAPAROSCOPIC    . THERAPEUTIC ABORTION      Prior to Admission medications   Medication Sig Start Date End Date Taking? Authorizing Provider  ibuprofen (ADVIL,MOTRIN) 600 MG tablet Take 1 tablet (600 mg total) by mouth every 6 (six) hours as needed for mild pain. 09/06/14   Jannet Mantis, CNM  levETIRAcetam (KEPPRA) 500 MG tablet Take 1 tablet (500 mg total) by mouth 2 (two) times daily. 02/18/16   Jeanmarie Plant, MD  meloxicam (MOBIC) 15 MG tablet Take 1 tablet (15 mg total) by mouth daily as needed for pain. 10/29/15   Renford Dills, NP  oxyCODONE-acetaminophen (ROXICET) 5-325 MG tablet Take 1 tablet by mouth every 8 (eight) hours as needed for moderate pain or severe pain (Do not drive or operate heavy machinery while taking as can cause drowsiness.). 10/29/15   Renford Dills, NP  Prenatal Vit-Fe Fumarate-FA (MULTIVITAMIN-PRENATAL) 27-0.8 MG TABS tablet Take 1 tablet by mouth daily at 12 noon.    [provider]    Allergies Patient has no known allergies.  Family History  Problem Relation Age of Onset  . Bipolar disorder Mother   . Heart failure Maternal Grandmother        has pacemaker  . Lung cancer Paternal Grandmother     Social History Social History  Substance Use Topics  . Smoking status: Former Smoker    Packs/day:  0.50    Types: Cigarettes  . Smokeless tobacco: Never Used  . Alcohol use No    Review of Systems Constitutional: No fever/chills Eyes: No visual changes. ENT: No sore throat. Cardiovascular: Denies chest pain. Respiratory: Denies shortness of breath. Gastrointestinal: Positive abdominal pain.  Positive nausea, no vomiting.  No diarrhea.  No constipation. Genitourinary: Positive for dysuria. Musculoskeletal:  Negative for back pain. Skin: Negative for rash. Neurological: Negative for headaches, focal weakness or numbness.  10-point ROS otherwise negative.  ____________________________________________   PHYSICAL EXAM:  VITAL SIGNS: ED Triage Vitals  Enc Vitals Group     BP 07/23/16 0002 136/87     Pulse Rate 07/23/16 0002 83     Resp 07/23/16 0002 18     Temp 07/23/16 0002 97.9 F (36.6 C)     Temp Source 07/23/16 0002 Oral     SpO2 07/23/16 0002 98 %     Weight 07/23/16 0004 180 lb (81.6 kg)     Height 07/23/16 0004 5' (1.524 m)     Head Circumference --      Peak Flow --      Pain Score 07/23/16 0002 10     Pain Loc --      Pain Edu? --      Excl. in GC? --     Constitutional: Alert and oriented x 4 well appearing nontoxic no diaphoresis speaks in full, clear sentences Eyes: PERRL EOMI. Head: Atraumatic. Nose: No congestion/rhinnorhea. Mouth/Throat: No trismus Neck: No stridor.   Cardiovascular: Normal rate, regular rhythm. Grossly normal heart sounds.  Good peripheral circulation. Respiratory: Normal respiratory effort.  No retractions. Lungs CTAB and moving good air Gastrointestinal: Soft no frank peritonitis mild diffuse tenderness with no rebound no guarding no focality Musculoskeletal: No lower extremity edema   Neurologic:  Normal speech and language. No gross focal neurologic deficits are appreciated. Skin:  Skin is warm, dry and intact. No rash noted. Psychiatric: Mood and affect are normal. Speech and behavior are normal.    ____________________________________________   _________________________________________   LABS (all labs ordered are listed, but only abnormal results are displayed)  Labs Reviewed  URINALYSIS, COMPLETE (UACMP) WITH MICROSCOPIC - Abnormal; Notable for the following:       Result Value   Color, Urine YELLOW (*)    APPearance HAZY (*)    Protein, ur 30 (*)    Squamous Epithelial / LPF 0-5 (*)    All other components within normal  limits  CBC WITH DIFFERENTIAL/PLATELET - Abnormal; Notable for the following:    HCT 34.5 (*)    RDW 16.3 (*)    All other components within normal limits  COMPREHENSIVE METABOLIC PANEL - Abnormal; Notable for the following:    Potassium 3.3 (*)    Glucose, Bld 113 (*)    Calcium 8.5 (*)    All other components within normal limits  HCG, QUANTITATIVE, PREGNANCY - Abnormal; Notable for the following:    hCG, Beta Chain, Quant, S 1,954 (*)    All other components within normal limits  POCT PREGNANCY, URINE - Abnormal; Notable for the following:    Preg Test, Ur POSITIVE (*)    All other components within normal limits    No signs of urinary tract infection labs unremarkable __________________________________________  EKG   ____________________________________________  RADIOLOGY  Ultrasound shows no intrauterine pregnancy ____________________________________________   PROCEDURES  Procedure(s) performed: no  Procedures  Critical Care performed: no  Observation: no ____________________________________________   INITIAL IMPRESSION /  ASSESSMENT AND PLAN / ED COURSE  Pertinent labs & imaging results that were available during my care of the patient were reviewed by me and considered in my medical decision making (see chart for details).  Before the patient arrives in my room she had her pelvic ultrasound done. After the ultrasound was complete the patient had a witnessed generalized tonic-clonic seizure. She was postictal for roughly 30 minutes thereafter. Fortunately the patient's ultrasound is unremarkable and her abdominal exam is benign. She and I had a lengthy discussion regarding persistent pain and discomfort after a dilation and curettage and that if her symptoms persist she would be best suited with follow-up with OB gynecologist who performed her procedure. At this point there is no indication of an abdominal catastrophe. Regarding her seizures she reports  compliance with her Keppra, however on chart review she is had multiple episodes of noncompliance. Regardless stress will lower her seizure threshold and she is certainly under tremendous amount of stress at this point. I've given her 1 g of Keppra and she is now neurologically intact. This point she is medically stable for outpatient management understands and agrees the plan.      ____________________________________________   FINAL CLINICAL IMPRESSION(S) / ED DIAGNOSES  Final diagnoses:  Generalized abdominal pain  Seizure (HCC)      NEW MEDICATIONS STARTED DURING THIS VISIT:  Discharge Medication List as of 07/23/2016  3:55 AM       Note:  This document was prepared using Dragon voice recognition software and may include unintentional dictation errors.     Merrily Brittleifenbark, Chelan Heringer, MD 07/23/16 2217

## 2016-07-23 NOTE — ED Notes (Signed)
Pt able to ambulate up to stat desk at this time to ask about wait time with NAD.

## 2016-10-30 ENCOUNTER — Encounter: Payer: Self-pay | Admitting: Emergency Medicine

## 2016-10-30 ENCOUNTER — Emergency Department
Admission: EM | Admit: 2016-10-30 | Discharge: 2016-10-31 | Disposition: A | Discharge status: Home / Self Care | Payer: Medicaid Other | Attending: Emergency Medicine | Admitting: Emergency Medicine

## 2016-10-30 DIAGNOSIS — I10 Essential (primary) hypertension: Secondary | ICD-10-CM | POA: Insufficient documentation

## 2016-10-30 DIAGNOSIS — R569 Unspecified convulsions: Secondary | ICD-10-CM

## 2016-10-30 DIAGNOSIS — Z79899 Other long term (current) drug therapy: Secondary | ICD-10-CM | POA: Insufficient documentation

## 2016-10-30 DIAGNOSIS — Z91148 Patient's other noncompliance with medication regimen for other reason: Secondary | ICD-10-CM

## 2016-10-30 DIAGNOSIS — Z87891 Personal history of nicotine dependence: Secondary | ICD-10-CM | POA: Insufficient documentation

## 2016-10-30 DIAGNOSIS — Z9114 Patient's other noncompliance with medication regimen: Secondary | ICD-10-CM | POA: Insufficient documentation

## 2016-10-30 DIAGNOSIS — E119 Type 2 diabetes mellitus without complications: Secondary | ICD-10-CM | POA: Insufficient documentation

## 2016-10-30 LAB — URINE DRUG SCREEN, QUALITATIVE (ARMC ONLY)
Amphetamines, Ur Screen: NOT DETECTED
Barbiturates, Ur Screen: NOT DETECTED
Benzodiazepine, Ur Scrn: NOT DETECTED
CANNABINOID 50 NG, UR ~~LOC~~: POSITIVE — AB
COCAINE METABOLITE, UR ~~LOC~~: NOT DETECTED
MDMA (Ecstasy)Ur Screen: NOT DETECTED
Methadone Scn, Ur: NOT DETECTED
OPIATE, UR SCREEN: NOT DETECTED
PHENCYCLIDINE (PCP) UR S: NOT DETECTED
TRICYCLIC, UR SCREEN: NOT DETECTED

## 2016-10-30 LAB — CBC
HEMATOCRIT: 34.2 % — AB (ref 35.0–47.0)
Hemoglobin: 11.6 g/dL — ABNORMAL LOW (ref 12.0–16.0)
MCH: 28.3 pg (ref 26.0–34.0)
MCHC: 33.9 g/dL (ref 32.0–36.0)
MCV: 83.4 fL (ref 80.0–100.0)
Platelets: 321 10*3/uL (ref 150–440)
RBC: 4.1 MIL/uL (ref 3.80–5.20)
RDW: 15.3 % — AB (ref 11.5–14.5)
WBC: 10.8 10*3/uL (ref 3.6–11.0)

## 2016-10-30 LAB — BASIC METABOLIC PANEL
Anion gap: 12 (ref 5–15)
BUN: 13 mg/dL (ref 6–20)
CHLORIDE: 108 mmol/L (ref 101–111)
CO2: 23 mmol/L (ref 22–32)
Calcium: 9.5 mg/dL (ref 8.9–10.3)
Creatinine, Ser: 0.99 mg/dL (ref 0.44–1.00)
GFR calc Af Amer: >60 mL/min (ref >60)
GFR calc non Af Amer: >60 mL/min (ref >60)
Glucose, Bld: 112 mg/dL — ABNORMAL HIGH (ref 65–99)
POTASSIUM: 3.3 mmol/L — AB (ref 3.5–5.1)
SODIUM: 143 mmol/L (ref 135–145)

## 2016-10-30 LAB — GLUCOSE, CAPILLARY: Glucose-Capillary: 93 mg/dL (ref 65–99)

## 2016-10-30 LAB — ETHANOL: Alcohol, Ethyl (B): <5 mg/dL (ref <5)

## 2016-10-30 LAB — POCT PREGNANCY, URINE: Preg Test, Ur: NEGATIVE

## 2016-10-30 MED ORDER — SODIUM CHLORIDE 0.9 % IV SOLN
1500.0000 mg | Freq: Once | INTRAVENOUS | Status: AC
  Administered 2016-10-30: 1500 mg via INTRAVENOUS
  Filled 2016-10-30: qty 15

## 2016-10-30 NOTE — ED Notes (Signed)
Pt given phone by RN Elon Jester and pt is trying to get in touch with mother at this time

## 2016-10-30 NOTE — ED Notes (Addendum)
Pt currently having a conversation to "Victorino Dike" pt speaking to individual that is "standing at the door talking to her" pt seems to be having visual hallucinations RN notified

## 2016-10-30 NOTE — ED Provider Notes (Signed)
[facility] Emergency Department Provider Note  ____________________________________________  Time seen: Approximately 11:21 PM  I have reviewed the triage vital signs and the nursing notes.   HISTORY  Chief Complaint Seizures  Level 5 caveat:  Portions of the history and physical were unable to be obtained due to post-ictal state   HPI [name] is a 26 y.o. female with a history of seizure disorder and noncompliance who presents after a seizure. Patient is still postictal.According to EMS they were called to the house after family witnessed a generalized tonic-clonic seizure. Patient is very confused and unable to answer questions appropriately at this time.  Past Medical History:  Diagnosis Date  . Depression   . Diabetes mellitus without complication (HCC)   . GBS bacteriuria   . Hx of preeclampsia, prior pregnancy, currently pregnant   . Hypertension   . IUGR (intrauterine growth restriction) in prior pregnancy, pregnant   . Obesity (BMI 35.0-39.9 without comorbidity)   . Seizures [facility])     Patient Active Problem List   Diagnosis Date Noted  . S/P cesarean section 09/04/2014  . GDM (gestational diabetes mellitus) 09/03/2014  . Gestational diabetes mellitus in third trimester 09/02/2014  . Antepartum mild preeclampsia 09/02/2014  . Mild preeclampsia 08/26/2014  . Pregnant 08/25/2014  . Elevated blood pressure affecting pregnancy in third trimester, antepartum 08/25/2014  . Indication for care in labor and delivery, antepartum 07/14/2014    Past Surgical History:  Procedure Laterality Date  . CESAREAN SECTION    . CESAREAN SECTION N/A 09/03/2014   Procedure: CESAREAN SECTION;  Surgeon: Nadara Mustard, MD;  Location: [facility] ORS;  Service: Obstetrics;  Laterality: N/A;  . CHOLECYSTECTOMY, LAPAROSCOPIC    . THERAPEUTIC ABORTION      Prior to Admission medications   Medication Sig Start Date End Date Taking? Authorizing Provider    ibuprofen (ADVIL,MOTRIN) 600 MG tablet Take 1 tablet (600 mg total) by mouth every 6 (six) hours as needed for mild pain. Patient not taking: Reported on 10/30/2016 09/06/14   Jannet Mantis, CNM  levETIRAcetam (KEPPRA) 500 MG tablet Take 1 tablet (500 mg total) by mouth 2 (two) times daily. 10/31/16   Nita Sickle, MD  meloxicam (MOBIC) 15 MG tablet Take 1 tablet (15 mg total) by mouth daily as needed for pain. Patient not taking: Reported on 10/30/2016 10/29/15   Renford Dills, NP  oxyCODONE-acetaminophen (ROXICET) 5-325 MG tablet Take 1 tablet by mouth every 8 (eight) hours as needed for moderate pain or severe pain (Do not drive or operate heavy machinery while taking as can cause drowsiness.). Patient not taking: Reported on 10/30/2016 10/29/15   Renford Dills, NP  Prenatal Vit-Fe Fumarate-FA (MULTIVITAMIN-PRENATAL) 27-0.8 MG TABS tablet Take 1 tablet by mouth daily at 12 noon.    [provider]    Allergies Patient has no known allergies.  Family History  Problem Relation Age of Onset  . Bipolar disorder Mother   . Heart failure Maternal Grandmother        has pacemaker  . Lung cancer Paternal Grandmother     Social History Social History  Substance Use Topics  . Smoking status: Former Smoker    Packs/day: 0.50    Types: Cigarettes  . Smokeless tobacco: Never Used  . Alcohol use No    Review of Systems Level 5 caveat:  Portions of the history and physical were unable to be obtained due to post-ictal state  Neurological: + Sz  ____________________________________________  PHYSICAL EXAM:  VITAL SIGNS: ED Triage Vitals  Enc Vitals Group     BP 10/30/16 2151 (!) 136/99     Pulse Rate 10/30/16 2151 89     Resp 10/30/16 2151 17     Temp 10/30/16 2151 97.6 F (36.4 C)     Temp Source 10/30/16 2151 Oral     SpO2 10/30/16 2151 100 %     Weight 10/30/16 2132 180 lb (81.6 kg)     Height --      Head Circumference --      Peak Flow --      Pain  Score --      Pain Loc --      Pain Edu? --      Excl. in GC? --     Constitutional: Alert and oriented x 1, no distress.  HEENT:      Head: Normocephalic and atraumatic.         Eyes: Conjunctivae are normal. Sclera is non-icteric.       Mouth/Throat: Mucous membranes are moist.       Neck: Supple with no signs of meningismus. Cardiovascular: Regular rate and rhythm. No murmurs, gallops, or rubs. 2+ symmetrical distal pulses are present in all extremities. No JVD. Respiratory: Normal respiratory effort. Lungs are clear to auscultation bilaterally. No wheezes, crackles, or rhonchi.  Gastrointestinal: Soft, non tender, and non distended with positive bowel sounds. No rebound or guarding. Musculoskeletal: Nontender with normal range of motion in all extremities. No edema, cyanosis, or erythema of extremities. Neurologic: Normal speech and language. Face is symmetric. Moving all extremities. No gross focal neurologic deficits are appreciated. Skin: Skin is warm, dry and intact. No rash noted. Psychiatric: Mood and affect are normal. Speech and behavior are normal.  ____________________________________________   LABS (all labs ordered are listed, but only abnormal results are displayed)  Labs Reviewed  URINE DRUG SCREEN, QUALITATIVE ([facility] ONLY) - Abnormal; Notable for the following:       Result Value   Cannabinoid 50 Ng, Ur [place] POSITIVE (*)    All other components within normal limits  BASIC METABOLIC PANEL - Abnormal; Notable for the following:    Potassium 3.3 (*)    Glucose, Bld 112 (*)    All other components within normal limits  CBC - Abnormal; Notable for the following:    Hemoglobin 11.6 (*)    HCT 34.2 (*)    RDW 15.3 (*)    All other components within normal limits  ETHANOL  GLUCOSE, CAPILLARY  POC URINE PREG, ED  POCT PREGNANCY, URINE  CBG MONITORING, ED   ____________________________________________  EKG  none   ____________________________________________  RADIOLOGY  none  ____________________________________________   PROCEDURES  Procedure(s) performed: None Procedures Critical Care performed:  None ____________________________________________   INITIAL IMPRESSION / ASSESSMENT AND PLAN / ED COURSE   26 y.o. female with a history of seizure disorder and noncompliance with seizure meds who presents after a seizure. Patient well appearing but post-ictal. Vitals WNL. Pregnancy negative. BG 93, labs with no acute findings. UDS positive for MJ. Will load with keppra due to history of non compliance and monitor until patient is back to baseline.  Clinical Course as of Oct 31 805  Mon Oct 31, 2016  9147 Continue to sleep comfortably. No seizure here. No family at the bedside.  [CV]    Clinical Course User Index [CV] Nita Sickle, MD    _________________________ 4:58 AM on 10/31/2016 -----------------------------------------  Patient is now  awake and back to baseline. Awaiting on family for discharge. Has not been taking Keppra for several weeks. Discussed risks of not taking her medications and recommended f/u with Neurologist.   Pertinent labs & imaging results that were available during my care of the patient were reviewed by me and considered in my medical decision making (see chart for details).    ____________________________________________   FINAL CLINICAL IMPRESSION(S) / ED DIAGNOSES  Final diagnoses:  Seizure (HCC)  Non compliance w medication regimen      NEW MEDICATIONS STARTED DURING THIS VISIT:  Discharge Medication List as of 10/31/2016  4:58 AM       Note:  This document was prepared using Dragon voice recognition software and may include unintentional dictation errors.    Nita Sickle, MD 10/31/16 [phone]

## 2016-10-30 NOTE — ED Triage Notes (Signed)
Pt arrived to ED per EMS from home. Per EMS, pt was witnessed by family "shaking." Family called out for seizure like activity. Per family pt has HX of seizures. Pt starts screaming and crying when rolled into ER bay due to police officers in hallway. Pt trying to get out of bed. Pt taken to RM 16.

## 2016-10-30 NOTE — ED Notes (Signed)
Pt bed sheets and pillow case changed by this tech and RN Elon Jester

## 2016-10-30 NOTE — ED Notes (Signed)
This tech at pt bedside to monitor for safety

## 2016-10-30 NOTE — ED Notes (Addendum)
Pt given wet wipes to clean her self off as well as blue paper scrubs and mesh underwear to put on. RN Marcelino Duster aware and at pt bedside at this time

## 2016-10-30 NOTE — ED Notes (Signed)
Pt trying to climb out the end of the bed - charge notified and ED Tech sitter assigned

## 2016-10-31 MED ORDER — ACETAMINOPHEN 500 MG PO TABS
1000.0000 mg | ORAL_TABLET | Freq: Once | ORAL | Status: AC
  Administered 2016-10-31: 1000 mg via ORAL

## 2016-10-31 MED ORDER — LEVETIRACETAM 500 MG PO TABS: 500.0000 mg | ORAL_TABLET | Freq: Two times a day (BID) | ORAL | 1 refills | Status: DC

## 2016-10-31 MED ORDER — ACETAMINOPHEN 500 MG PO TABS
ORAL_TABLET | ORAL | Status: AC
  Administered 2016-10-31: 1000 mg via ORAL
  Filled 2016-10-31: qty 2

## 2016-10-31 NOTE — ED Notes (Signed)
Per Dr. Don Perking pt okay for discharge, pt walking around room talking on phone

## 2016-10-31 NOTE — Discharge Instructions (Signed)
Seizures may happen at any time. It is important to take certain precautions to maintain your safety.  ° °Follow up with your doctor in 1-3 days. ° °If you were started on a seizure medication, take it as prescribed. ° °During a seizure, a person may injure himself or herself. Seizure precautions are guidelines that a person can follow in order to minimize injury during a seizure. For any activity, it is important to ask, "What would happen if I had a seizure while doing this?" Follow the below precautions. ° °Bathroom Safety  °A person with seizures may want to shower instead of bathe to avoid accidental drowning. If falls occur during the patient's typical seizure, a person should use a shower seat, preferably one with a safety strap.  °Use nonskid strips in your shower or tub.  °Never use electrical equipment near water. This prevents accidental electrocution.  °Consider changing glass in shower doors to shatterproof glass. ° °Kitchen Safety °If possible, cook when someone else is nearby.  °Use the back burners of the stove to prevent accidental burns.  °Use shatterproof containers as much as possible. For instance, sauces can be transferred from glass bottles to plastic containers for use.  °Limit time that is required using knives or other sharp objects. If possible, buy foods that are already cut, or ask someone to help in meal preparation.  ° °General Safety at Home °Do not smoke or light fires in the fireplace unless someone else is present.  °Do not use space heaters that can be accidentally overturned.  °When alone, avoid using step stools or ladders, and do not clean rooftop gutters.  °Purchase power tools and motorized lawn equipment which have a safety switch that will stop the machine if you release the handle (a 'dead man's' switch).  ° °Driving and Transportation °Avoid driving unless your seizures are well controlled and/or you have permission to drive from your state's Department of Motor Vehicles    °(DMV). Each state has different laws. Please refer to the following link on the Epile[employer] of [employer]'s website for more information: http://www.epilepsyfoundation.org/answerplace/Social/driving/drivingu.cfm  °If you ride a bicycle, wear a helmet and any other necessary protective gear.  °When taking public transportation like the bus or subway, stay clear of the platform edge.  ° °Outdoor and Sports Safety °Swimming is okay, but does present certain risks. Never swim alone, and tell friends what to do if you have a seizure while swimming.  °Wear appropriate protective equipment.  °Ski with a friend. If a seizure occurs, your friend can seek help, if needed. He or she can also help to get you out of the cold. Consider using a safety hook or belt while riding the ski lift.  ° ° °

## 2016-10-31 NOTE — ED Notes (Addendum)
RN Demetrio Lapping spoke with Curley Spice (pt kids father) and he gave her pt mother's #, pt on the phone attempting to call mother at this time.Marland KitchenMarland KitchenMarland Kitchen# given to RN not a working #

## 2016-10-31 NOTE — ED Notes (Signed)
Pt waking up at this time startled (no noise in pt room at the time) asking "what happened? Can I have something to drink?" this tech hands pt her drink and explain that she had a seizure and that she was in the hospital

## 2016-10-31 NOTE — ED Notes (Signed)
Pt was able to get in touch with mother however, mother doesn't drive, pt is attempting to call dad at this time

## 2016-10-31 NOTE — ED Notes (Signed)
Pt advised to call a ride by MD Don Perking, pt given phone by this tech

## 2016-10-31 NOTE — ED Notes (Signed)
This looked up pt employer # at [facility] [phone] for her to be able to get in contact with them to inform them that she would not be in to work today

## 2016-10-31 NOTE — ED Notes (Signed)
Pt awakened and asking for the doctor, this tech to MD desk asking for status update. MD Don Perking at pt bedside at this time

## 2016-10-31 NOTE — ED Notes (Signed)
Pt lying in bed resting at this time, per MD Don Perking pt no longer needs a Recruitment consultant. This tech informed pt that this tech would no longer be at the pt bedside and pt call bell is within reach if she needs anything.

## 2016-10-31 NOTE — ED Notes (Addendum)
Pt on the phone with kids father at this time stating multiple times " I have to get the kids to school" father repeats multiple times to pt that there is only 2 children in school and that the 27 year old doesn't attend school, pt still showing some signs of confusion to life situations but is alert & oriented to place at this time, pt is unable to find a ride at this time, will continue to try

## 2016-10-31 NOTE — ED Notes (Addendum)
Pt asking for Rn at bedside, pt is currently on the phone with father of her children

## 2017-03-14 NOTE — L&D Delivery Note (Signed)
Delivery Summary for [name]  Labor Events:   Preterm labor:   Rupture date:   Rupture time:   Rupture type: Intact  Fluid Color:   Induction:   Augmentation:   Complications:   Cervical ripening:          Delivery:   Episiotomy:   Lacerations:   Repair suture:   Repair # of packets:   Blood loss (ml): 500   Information for the patient's newborn:  Rosalita ChessmanBurton, Boy Elga [[id number]][[id number]]    Delivery 02/19/2018 8:18 AM by  C-Section, Low Transverse Sex:  female Gestational Age: [id number] Delivery Clinician:   Living?:         APGARS  One minute Five minutes Ten minutes  Skin color:        Heart rate:        Grimace:        Muscle tone:        Breathing:        Totals: 8  9      Presentation/position:      Resuscitation:   Cord information:    Disposition of cord blood:     Blood gases sent?  Complications:   Placenta: Delivered:       appearance Newborn Measurements: Weight: 6 lb 10.5 oz (3020 g)  Height: 18.9"  Head circumference:    Chest circumference:    Other providers:    Additional  information: Forceps:   Vacuum:   Breech:   Observed anomalies        See Dr. Oretha Milchherry's operative note for details of C-section procedure.    Hildred Laserherry, Adwoa Axe, MD [facility]

## 2017-05-22 ENCOUNTER — Encounter: Payer: Self-pay | Admitting: Obstetrics and Gynecology

## 2017-08-22 ENCOUNTER — Encounter: Payer: Self-pay | Admitting: Obstetrics and Gynecology

## 2017-08-22 ENCOUNTER — Ambulatory Visit (INDEPENDENT_AMBULATORY_CARE_PROVIDER_SITE_OTHER): Payer: Medicaid Other | Admitting: Obstetrics and Gynecology

## 2017-08-22 VITALS — BP 116/82 | HR 87 | Wt 188.6 lb

## 2017-08-22 DIAGNOSIS — Z98891 History of uterine scar from previous surgery: Secondary | ICD-10-CM

## 2017-08-22 DIAGNOSIS — Z202 Contact with and (suspected) exposure to infections with a predominantly sexual mode of transmission: Secondary | ICD-10-CM

## 2017-08-22 DIAGNOSIS — Z3687 Encounter for antenatal screening for uncertain dates: Secondary | ICD-10-CM

## 2017-08-22 DIAGNOSIS — O0992 Supervision of high risk pregnancy, unspecified, second trimester: Secondary | ICD-10-CM

## 2017-08-22 DIAGNOSIS — Z720 Tobacco use: Secondary | ICD-10-CM

## 2017-08-22 DIAGNOSIS — F1290 Cannabis use, unspecified, uncomplicated: Secondary | ICD-10-CM

## 2017-08-22 DIAGNOSIS — R638 Other symptoms and signs concerning food and fluid intake: Secondary | ICD-10-CM

## 2017-08-22 MED ORDER — CONCEPT OB 130-92.4-1 MG PO CAPS: 1.0000 | ORAL_CAPSULE | Freq: Every day | ORAL | 11 refills | Status: DC

## 2017-08-22 NOTE — Progress Notes (Signed)
NOB:    Patient presents today for her new OB physical.  This was not an intended pregnancy.  She still remains somewhat emotional regarding this. She is taking prenatal vitamins. She has had 3 prior cesarean deliveries.  Her last pregnancy ended with pregnancy-induced hypertension and cesarean delivery.  She will be scheduled for a repeat cesarean delivery. Patient states she also had gestational diabetes with her last pregnancy.  Physical examination General NAD, Conversant  HEENT Atraumatic; Op clear with mmm.  Normo-cephalic. Pupils reactive. Anicteric sclerae  Thyroid/Neck Smooth without nodularity or enlargement. Normal ROM.  Neck Supple.  Skin No rashes, lesions or ulceration. Normal palpated skin turgor. No nodularity.  Breasts: No masses or discharge.  Symmetric.  No axillary adenopathy.  Lungs: Clear to auscultation.No rales or wheezes. Normal Respiratory effort, no retractions.  Heart: NSR.  No murmurs or rubs appreciated. No periferal edema  Abdomen: Soft.  Non-tender.  No masses.  No HSM. No hernia  Extremities: Moves all appropriately.  Normal ROM for age. No lymphadenopathy.  Neuro: Oriented to PPT.  Normal mood. Normal affect.     Pelvic:   Vulva: Normal appearance.  No lesions.  Vagina: No lesions or abnormalities noted.  Support: Normal pelvic support.  Urethra No masses tenderness or scarring.  Meatus Normal size without lesions or prolapse.  Cervix: Normal appearance.  No lesions.  Anus: Normal exam.  No lesions.  Perineum: Normal exam.  No lesions.        Bimanual   Adnexae: No masses.  Non-tender to palpation.  Uterus: Enlarged. 17 wks  POS FHT's  Non-tender.  Mobile.  AV.  Adnexae: No masses.  Non-tender to palpation.  Cul-de-sac: Negative for abnormality.  Adnexae: No masses.  Non-tender to palpation.         Pelvimetry   Diagonal: Reached.  Spines: Average.  Sacrum: Concave.  Pubic Arch: Normal.   Assessment:    Size greater than dates  History of  PIH  History of gestational diabetes  History of 3 prior cesarean deliveries  Tobacco use  Previous marijuana use (patient states that she has quit once she found out she was pregnant)  Plan:  Prenatal Plan 1.  The patient was given prenatal literature. 2.  She was continued on prenatal vitamins. 3.  A prenatal lab panel was ordered or drawn. 4.  An ultrasound was ordered to better determine an EDC. 5.  Patient advised to discontinue tobacco use and maintain her abstinence from marijuana 6.  Genetic testing and testing for other inheritable conditions discussed in detail. She will decide in the future whether to have these labs performed. 7.  A general overview of pregnancy testing, visit schedule, ultrasound schedule, and prenatal care was discussed. 8.  81 mg ASA discussed in detail patient to begin now 9.  TSH and early 1 hour GCT 10.  Pap GC/CT performed today

## 2017-08-22 NOTE — Progress Notes (Signed)
NOB PE. Seen at [facility] for confirmation. 08/16/2017.

## 2017-08-23 LAB — URINALYSIS, ROUTINE W REFLEX MICROSCOPIC
BILIRUBIN UA: NEGATIVE
Glucose, UA: NEGATIVE
Leukocytes, UA: NEGATIVE
NITRITE UA: NEGATIVE
RBC UA: NEGATIVE
Urobilinogen, Ur: 1 mg/dL (ref 0.2–1.0)
pH, UA: 6 (ref 5.0–7.5)

## 2017-08-23 LAB — ABO AND RH: RH TYPE: POSITIVE

## 2017-08-23 LAB — CBC WITH DIFFERENTIAL/PLATELET
BASOS: 0 %
Basophils Absolute: 0 10*3/uL (ref 0.0–0.2)
EOS (ABSOLUTE): 0.1 10*3/uL (ref 0.0–0.4)
Eos: 2 %
Hematocrit: 33.2 % — ABNORMAL LOW (ref 34.0–46.6)
Hemoglobin: 11 g/dL — ABNORMAL LOW (ref 11.1–15.9)
IMMATURE GRANS (ABS): 0 10*3/uL (ref 0.0–0.1)
IMMATURE GRANULOCYTES: 0 %
Lymphocytes Absolute: 2.1 10*3/uL (ref 0.7–3.1)
Lymphs: 31 %
MCH: 27.9 pg (ref 26.6–33.0)
MCHC: 33.1 g/dL (ref 31.5–35.7)
MCV: 84 fL (ref 79–97)
MONOS ABS: 0.5 10*3/uL (ref 0.1–0.9)
Monocytes: 7 %
NEUTROS PCT: 60 %
Neutrophils Absolute: 4.2 10*3/uL (ref 1.4–7.0)
PLATELETS: 295 10*3/uL (ref 150–450)
RBC: 3.94 x10E6/uL (ref 3.77–5.28)
RDW: 15.5 % — AB (ref 12.3–15.4)
WBC: 6.9 10*3/uL (ref 3.4–10.8)

## 2017-08-23 LAB — RPR: RPR Ser Ql: NONREACTIVE

## 2017-08-23 LAB — HIV ANTIBODY (ROUTINE TESTING W REFLEX): HIV Screen 4th Generation wRfx: NONREACTIVE

## 2017-08-23 LAB — RUBELLA SCREEN: Rubella Antibodies, IGG: 3.88 index (ref >0.99)

## 2017-08-23 LAB — MICROSCOPIC EXAMINATION: Casts: NONE SEEN /lpf

## 2017-08-23 LAB — HEMOGLOBIN A1C
ESTIMATED AVERAGE GLUCOSE: 123 mg/dL
HEMOGLOBIN A1C: 5.9 % — AB (ref 4.8–5.6)

## 2017-08-23 LAB — SICKLE CELL SCREEN: Sickle Cell Screen: NEGATIVE

## 2017-08-23 LAB — VARICELLA ZOSTER ANTIBODY, IGG: Varicella zoster IgG: 1241 index (ref >165)

## 2017-08-23 LAB — ANTIBODY SCREEN: ANTIBODY SCREEN: NEGATIVE

## 2017-08-23 LAB — TSH: TSH: 0.216 u[IU]/mL — ABNORMAL LOW (ref 0.450–4.500)

## 2017-08-23 LAB — HEPATITIS B SURFACE ANTIGEN: HEP B S AG: NEGATIVE

## 2017-08-24 LAB — URINE CULTURE, OB REFLEX

## 2017-08-24 LAB — CULTURE, OB URINE

## 2017-08-30 ENCOUNTER — Telehealth: Payer: Self-pay | Admitting: Obstetrics and Gynecology

## 2017-08-30 LAB — NICOTINE SCREEN, URINE: Cotinine Ql Scrn, Ur: POSITIVE ng/mL — AB

## 2017-08-30 LAB — DRUG PROFILE, UR, 9 DRUGS (LABCORP)
AMPHETAMINES, URINE: NEGATIVE ng/mL
Barbiturate Quant, Ur: NEGATIVE ng/mL
Benzodiazepine Quant, Ur: NEGATIVE ng/mL
CANNABINOID QUANT UR: POSITIVE — AB
COCAINE (METAB.): NEGATIVE ng/mL
METHADONE SCREEN, URINE: NEGATIVE ng/mL
Opiate Quant, Ur: NEGATIVE ng/mL
PCP Quant, Ur: NEGATIVE ng/mL
PROPOXYPHENE: NEGATIVE ng/mL

## 2017-08-30 NOTE — Telephone Encounter (Signed)
The patients Dentist office called and stating they need a dental clearance for a in office x-ray. [name]: [phone]: [phone]. Thank you.

## 2017-09-07 LAB — PAP IG, CT-NG, RFX HPV ASCU
CHLAMYDIA, NUC. ACID AMP: NEGATIVE
GONOCOCCUS BY NUCLEIC ACID AMP: NEGATIVE
PAP Smear Comment: 0

## 2017-09-07 LAB — HPV DNA PROBE HIGH RISK, AMPLIFIED: HPV, HIGH-RISK: NEGATIVE

## 2017-09-19 ENCOUNTER — Encounter: Payer: Self-pay | Admitting: Obstetrics and Gynecology

## 2017-09-19 ENCOUNTER — Ambulatory Visit (INDEPENDENT_AMBULATORY_CARE_PROVIDER_SITE_OTHER): Payer: Medicaid Other

## 2017-09-19 ENCOUNTER — Ambulatory Visit (INDEPENDENT_AMBULATORY_CARE_PROVIDER_SITE_OTHER): Payer: Medicaid Other | Admitting: Obstetrics and Gynecology

## 2017-09-19 VITALS — BP 124/86 | HR 91 | Wt 189.6 lb

## 2017-09-19 DIAGNOSIS — G43C0 Periodic headache syndromes in child or adult, not intractable: Secondary | ICD-10-CM | POA: Insufficient documentation

## 2017-09-19 DIAGNOSIS — Z720 Tobacco use: Secondary | ICD-10-CM | POA: Insufficient documentation

## 2017-09-19 DIAGNOSIS — O0992 Supervision of high risk pregnancy, unspecified, second trimester: Secondary | ICD-10-CM

## 2017-09-19 DIAGNOSIS — O99351 Diseases of the nervous system complicating pregnancy, first trimester: Secondary | ICD-10-CM

## 2017-09-19 DIAGNOSIS — O34219 Maternal care for unspecified type scar from previous cesarean delivery: Secondary | ICD-10-CM

## 2017-09-19 DIAGNOSIS — O99210 Obesity complicating pregnancy, unspecified trimester: Secondary | ICD-10-CM | POA: Insufficient documentation

## 2017-09-19 DIAGNOSIS — Z716 Tobacco abuse counseling: Secondary | ICD-10-CM

## 2017-09-19 DIAGNOSIS — Z98891 History of uterine scar from previous surgery: Secondary | ICD-10-CM

## 2017-09-19 DIAGNOSIS — G40909 Epilepsy, unspecified, not intractable, without status epilepticus: Secondary | ICD-10-CM

## 2017-09-19 DIAGNOSIS — O09299 Supervision of pregnancy with other poor reproductive or obstetric history, unspecified trimester: Secondary | ICD-10-CM

## 2017-09-19 DIAGNOSIS — Z3A16 16 weeks gestation of pregnancy: Secondary | ICD-10-CM | POA: Diagnosis not present

## 2017-09-19 DIAGNOSIS — F1211 Cannabis abuse, in remission: Secondary | ICD-10-CM | POA: Insufficient documentation

## 2017-09-19 DIAGNOSIS — F1210 Cannabis abuse, uncomplicated: Secondary | ICD-10-CM

## 2017-09-19 DIAGNOSIS — Z8632 Personal history of gestational diabetes: Secondary | ICD-10-CM

## 2017-09-19 DIAGNOSIS — Z3687 Encounter for antenatal screening for uncertain dates: Secondary | ICD-10-CM

## 2017-09-19 DIAGNOSIS — K0889 Other specified disorders of teeth and supporting structures: Secondary | ICD-10-CM

## 2017-09-19 MED ORDER — LEVETIRACETAM 500 MG PO TABS: 500.0000 mg | ORAL_TABLET | Freq: Two times a day (BID) | ORAL | 9 refills | Status: AC

## 2017-09-19 MED ORDER — BUPROPION HCL ER (XL) 150 MG PO TB24: 150.0000 mg | ORAL_TABLET | Freq: Every day | ORAL | 3 refills | Status: DC

## 2017-09-19 MED ORDER — ACETAMINOPHEN-CODEINE #3 300-30 MG PO TABS: 1.0000 | ORAL_TABLET | Freq: Four times a day (QID) | ORAL | 1 refills | Status: DC | PRN

## 2017-09-19 NOTE — Progress Notes (Signed)
ROB: Patient c/o pain and pressure in abdominal area, thinks she is already having [employer] CorporationBraxton Hicks. Complains of leg pain as well, but note she stands for 12 hrs per day at work. Advised on pregnancy girdle.  Reviewed patient's history.  Patient also with h/o seizures, currently not on meds. Last seizure was 6 months ago. Tends to occasionally f/u in the ER, but has has never followed up with a Neurologist. Notes that she has been on Keppra in the remote past, but once prescription ran out she stopped taking. Advised importance of seizure control in pregnancy. Will reinitiate Keppra, start at 500 mg BID. Will also need referral to Neurologist.  Patient c/o headaches/migraines and tooth pain, not relieved with ES Tylenol, was previously taking NSAIDs until several weeks ago when she discovered she was pregnant. Has had a dental appointment with 2 upper teeth treated.  Has another dental appointment to address 2 bottom teeth (dental caries, cavities) in 2 weeks.  Will prescribe T#3 until dental appointment. Should also help with migraine headaches.  In addition, patient notes that she desires to stop smoking (currently using both tobacco and marijuana). Uses marijuana to boost appetite, discussed that this was not necessary as patient has h/o obesity, does not require much weight gain in current pregnancy, encouraged cessation. Currently smokes 2-4 cig/day, has tried cutting down in the past but unsuccessful.  Desires something to help with smoking. Discussed nicotine patches, gum, and medications.  Will prescribe Wellbutrin as patient also mentions during the visit that she has suffered from depression and anxiety, and is currently having mild symptoms.  Lastly, patient desires genetic testing, will order quad screen, will return to complete as lab tech gone for the day. She will also need early glucola screen due to elevated A1c on prenatal labs and h/o GDM in a prior pregnancy. RTC in 3 weeks for anatomy scan, and 4  weeks for OB visit. Today's dating scan consistent with patient's dates. Patient notes she [place] delivering in [place], near where she works (however lives in [place]). Discussed that she would need to be established with a physician in [place] (however patient's Medicaid would only be valid for [facility] providers and would likely not qualify for [place] Medicaid as she was not a resident). Noted that she can be seen on an emergent basis if this hospital is closest to her job, but could not walk in and desire to have her scheduled C-section there.    The patient has Medicaid.  [facility] Medicaid Risk Screening Form completed today  A total of 25 minutes were spent face-to-face with the patient during this encounter and over half of that time involved counseling and coordination of care.   Hildred Laserherry, Jailyn Langhorst, MD [facility]

## 2017-09-19 NOTE — Progress Notes (Signed)
ROB-pt stated that she has pain and pressure in the abd area. Pt stated that she feels her abd tighten pt think she is having contractions.

## 2017-09-20 LAB — POCT URINALYSIS DIPSTICK
BILIRUBIN UA: NEGATIVE
Glucose, UA: NEGATIVE
Ketones, UA: NEGATIVE
LEUKOCYTES UA: NEGATIVE
NITRITE UA: NEGATIVE
PH UA: 6.5 (ref 5.0–8.0)
PROTEIN UA: POSITIVE — AB
RBC UA: NEGATIVE
Spec Grav, UA: 1.025 (ref 1.010–1.025)
UROBILINOGEN UA: 0.2 U/dL

## 2017-09-28 ENCOUNTER — Telehealth: Payer: Self-pay | Admitting: Obstetrics and Gynecology

## 2017-09-28 NOTE — Telephone Encounter (Signed)
The patient called and was on hold.  She is just requesting a call back by the nurse with her recent test results, please advise, thanks.

## 2017-09-29 NOTE — Telephone Encounter (Signed)
[facility] and [facility] baby aspirin 81 mg daily to prevent preeclampsia.

## 2017-10-09 ENCOUNTER — Ambulatory Visit: Payer: Medicaid Other

## 2017-10-09 ENCOUNTER — Other Ambulatory Visit: Payer: Self-pay

## 2017-10-09 DIAGNOSIS — O0992 Supervision of high risk pregnancy, unspecified, second trimester: Secondary | ICD-10-CM

## 2017-10-09 DIAGNOSIS — Z8632 Personal history of gestational diabetes: Secondary | ICD-10-CM

## 2017-10-09 DIAGNOSIS — Z3482 Encounter for supervision of other normal pregnancy, second trimester: Secondary | ICD-10-CM

## 2017-10-10 ENCOUNTER — Ambulatory Visit
Admission: RE | Admit: 2017-10-10 | Discharge: 2017-10-10 | Disposition: A | Discharge status: Home / Self Care | Payer: Medicaid Other | Source: Ambulatory Visit | Attending: Obstetrics and Gynecology | Admitting: Obstetrics and Gynecology

## 2017-10-10 DIAGNOSIS — Z3482 Encounter for supervision of other normal pregnancy, second trimester: Secondary | ICD-10-CM | POA: Diagnosis not present

## 2017-10-10 LAB — GLUCOSE, 1 HOUR GESTATIONAL: GESTATIONAL DIABETES SCREEN: 126 mg/dL (ref 65–139)

## 2017-10-17 ENCOUNTER — Ambulatory Visit (INDEPENDENT_AMBULATORY_CARE_PROVIDER_SITE_OTHER): Payer: Medicaid Other | Admitting: Obstetrics and Gynecology

## 2017-10-17 ENCOUNTER — Other Ambulatory Visit: Payer: Self-pay

## 2017-10-17 ENCOUNTER — Encounter: Payer: Self-pay | Admitting: Obstetrics and Gynecology

## 2017-10-17 VITALS — BP 121/72 | HR 84 | Wt 193.0 lb

## 2017-10-17 DIAGNOSIS — Z98891 History of uterine scar from previous surgery: Secondary | ICD-10-CM

## 2017-10-17 DIAGNOSIS — O0992 Supervision of high risk pregnancy, unspecified, second trimester: Secondary | ICD-10-CM

## 2017-10-17 DIAGNOSIS — Z3482 Encounter for supervision of other normal pregnancy, second trimester: Secondary | ICD-10-CM

## 2017-10-17 DIAGNOSIS — G40909 Epilepsy, unspecified, not intractable, without status epilepticus: Secondary | ICD-10-CM

## 2017-10-17 LAB — POCT URINALYSIS DIPSTICK
BILIRUBIN UA: NEGATIVE
Blood, UA: NEGATIVE
GLUCOSE UA: NEGATIVE
Ketones, UA: NEGATIVE
Leukocytes, UA: NEGATIVE
Nitrite, UA: NEGATIVE
Protein, UA: NEGATIVE
Spec Grav, UA: 1.015 (ref 1.010–1.025)
Urobilinogen, UA: 0.2 E.U./dL
pH, UA: 7 (ref 5.0–8.0)

## 2017-10-17 NOTE — Telephone Encounter (Signed)
Pt was call due to her asking DJE about her test results of her Quad. Pt was called to inform her that she never received any testing for genetic screening. Pt stated that she came in a couple of weeks ago and had some blood work done. Pt was informed that was her early GTT testing. Pt stated that she had 2 tubes of blood drawn that day. Pt was informed again that was not generic testing that the blood drawn was for her GTT. Pt was advised if she wanted to have a Quad done that she needed to come in sometime next week to have that done due to her gestational preganancy age. Pt continued to argue and stated that she had it done. Pt stated harshly that she would call me back and hung up.

## 2017-10-17 NOTE — Progress Notes (Signed)
ROB: Patient now on Keppra.  Had teeth pulled and feels better.  Denies contractions.  Complains of feet swelling at the end of the day but better in the morning.  No edema today.  Baby moving every day.  Normal FAS.

## 2017-10-17 NOTE — Progress Notes (Signed)
Pt states a concern for swelling in her feet.

## 2017-10-25 ENCOUNTER — Encounter: Payer: Self-pay | Admitting: Obstetrics and Gynecology

## 2017-10-25 ENCOUNTER — Ambulatory Visit (INDEPENDENT_AMBULATORY_CARE_PROVIDER_SITE_OTHER): Payer: Medicaid Other | Admitting: Obstetrics and Gynecology

## 2017-10-25 VITALS — BP 122/80 | HR 97 | Ht 60.0 in | Wt 191.0 lb

## 2017-10-25 DIAGNOSIS — Z331 Pregnant state, incidental: Secondary | ICD-10-CM | POA: Diagnosis not present

## 2017-10-25 DIAGNOSIS — F1210 Cannabis abuse, uncomplicated: Secondary | ICD-10-CM

## 2017-10-25 DIAGNOSIS — G40909 Epilepsy, unspecified, not intractable, without status epilepticus: Secondary | ICD-10-CM | POA: Diagnosis not present

## 2017-10-25 DIAGNOSIS — F17210 Nicotine dependence, cigarettes, uncomplicated: Secondary | ICD-10-CM | POA: Diagnosis not present

## 2017-10-25 DIAGNOSIS — Z720 Tobacco use: Secondary | ICD-10-CM

## 2017-10-25 DIAGNOSIS — Z3482 Encounter for supervision of other normal pregnancy, second trimester: Secondary | ICD-10-CM

## 2017-10-25 LAB — POCT URINALYSIS DIPSTICK
Bilirubin, UA: NEGATIVE
Glucose, UA: NEGATIVE
Ketones, UA: NEGATIVE
LEUKOCYTES UA: NEGATIVE
NITRITE UA: NEGATIVE
Protein, UA: POSITIVE — AB
RBC UA: NEGATIVE
SPEC GRAV UA: 1.015 (ref 1.010–1.025)
Urobilinogen, UA: 0.2 E.U./dL
pH, UA: 6.5 (ref 5.0–8.0)

## 2017-10-25 NOTE — Progress Notes (Signed)
HPI:      [name] is a 27 y.o. [id number] who LMP was [name] last menstrual period was 05/22/2017 (lmp unknown).  Subjective:   She presents today with multiple issues.  She complains that she has begun having seizures again despite taking Keppra.  She has also been taking Wellbutrin.  She was taking it to stop smoking but says that she still smokes approximately 3 cigarettes/day.  She would also like to discuss her cigarette and marijuana use.  She would also like to discuss work and her prolonged standing job.  She is asking for a work note that confirms that she is pregnant.    Hx: The following portions of the patient's history were reviewed and updated as appropriate:             She  has a past medical history of Depression, Diabetes mellitus without complication (HCC), GBS bacteriuria, preeclampsia, prior pregnancy, currently pregnant, Hypertension, IUGR (intrauterine growth restriction) in prior pregnancy, pregnant, Obesity (BMI 35.0-39.9 without comorbidity), and Seizures (HCC). She does not have any pertinent problems on file. She  has a past surgical history that includes Cesarean section; Cholecystectomy, laparoscopic; Cesarean section (N/A, 09/03/2014); and Therapeutic abortion. Her family history includes Bipolar disorder in her mother; Heart failure in her maternal grandmother; Lung cancer in her paternal grandmother. She  reports that she has been smoking cigarettes. She has been smoking about 0.50 packs per day. She has never used smokeless tobacco. She reports that she has current or past drug history. Drug: Marijuana. She reports that she does not drink alcohol. She has a current medication list which includes the following prescription(s): acetaminophen, acetaminophen-codeine, bupropion, levetiracetam, concept ob, and multivitamin-prenatal. She has No Known Allergies.       Review of Systems:  Review of Systems  Constitutional: Denied constitutional symptoms, night  sweats, recent illness, fatigue, fever, insomnia and weight loss.  Eyes: Denied eye symptoms, eye pain, photophobia, vision change and visual disturbance.  Ears/Nose/Throat/Neck: Denied ear, nose, throat or neck symptoms, hearing loss, nasal discharge, sinus congestion and sore throat.  Cardiovascular: Denied cardiovascular symptoms, arrhythmia, chest pain/pressure, edema, exercise intolerance, orthopnea and palpitations.  Respiratory: Denied pulmonary symptoms, asthma, pleuritic pain, productive sputum, cough, dyspnea and wheezing.  Gastrointestinal: Denied, gastro-esophageal reflux, melena, nausea and vomiting.  Genitourinary: Denied genitourinary symptoms including symptomatic vaginal discharge, pelvic relaxation issues, and urinary complaints.  Musculoskeletal: Denied musculoskeletal symptoms, stiffness, swelling, muscle weakness and myalgia.  Dermatologic: Denied dermatology symptoms, rash and scar.  Neurologic: See HPI for additional information.  Psychiatric: Denied psychiatric symptoms, anxiety and depression.  Endocrine: Denied endocrine symptoms including hot flashes and night sweats.   Meds:   Current Outpatient Medications on File Prior to Visit  Medication Sig Dispense Refill  . acetaminophen (TYLENOL) 325 MG tablet Take 650 mg by mouth every 6 (six) hours as needed.    Marland Kitchen. acetaminophen-codeine (TYLENOL #3) 300-30 MG tablet Take 1-2 tablets by mouth every 6 (six) hours as needed for moderate pain. 30 tablet 1  . buPROPion (WELLBUTRIN XL) 150 MG 24 hr tablet Take 1 tablet (150 mg total) by mouth daily. 30 tablet 3  . levETIRAcetam (KEPPRA) 500 MG tablet Take 1 tablet (500 mg total) by mouth 2 (two) times daily. 60 tablet 9  . Prenat w/o A Vit-FeFum-FePo-FA (CONCEPT OB) 130-92.4-1 MG CAPS Take 1 capsule by mouth daily. 30 capsule 11  . Prenatal Vit-Fe Fumarate-FA (MULTIVITAMIN-PRENATAL) 27-0.8 MG TABS tablet Take 1 tablet by mouth daily at 12 noon.  No current  facility-administered medications on file prior to visit.     Objective:     Vitals:   10/25/17 1511  BP: 122/80  Pulse: 97              See prenatal record  Assessment:    [id number] Patient Active Problem List   Diagnosis Date Noted  . H/O pre-eclampsia in prior pregnancy, currently pregnant 09/19/2017  . Obesity in pregnancy 09/19/2017  . Tobacco abuse 09/19/2017  . Marijuana abuse 09/19/2017  . Seizure disorder (HCC) 09/19/2017  . Periodic headache syndrome, not intractable 09/19/2017  . S/P cesarean section 09/04/2014  . History of gestational diabetes 09/03/2014     1. Encounter for supervision of other normal pregnancy in second trimester   2. Seizure disorder (HCC)   3. Marijuana abuse   4. Tobacco abuse     It is possible that the Wellbutrin is interacting with the Keppra and decreasing her seizure threshold.  As the Wellbutrin is not working for smoking cessation and the patient is not taking appropriately I have advised her to discontinue the Wellbutrin.  The patient has an appointment with a neurologist in the next 3 weeks.  I have strongly advised her to present as scheduled for this appointment.  Patient is smoking approximately 3 cigarettes/day I have advised her to discontinue this but have reassured her that this is a small amount and her continued attempts at decreasing this amount is worthwhile.  She asked about smoking marijuana and I specifically discussed the rationale for its contraindication especially during pregnancy.   Plan:            1.  Patient will discontinue Wellbutrin  2.  She needs a work note confirming that she is pregnant. Orders Orders Placed This Encounter  Procedures  . POCT urinalysis dipstick    No orders of the defined types were placed in this encounter.     F/U  Return for As scheduled. I spent 32 minutes involved in the care of this patient of which greater than 50% was spent discussing seizure disorder, Wellbutrin,  smoking cessation, and traction of Wellbutrin and bupropion, her [facility], smoking cessation and marijuana cessation, effect of smoking and marijuana on pregnancy, work restrictions.  Elonda Huskyavid J. Evans, M.D. 10/25/2017 3:54 PM

## 2017-10-25 NOTE — Progress Notes (Signed)
Pt states she is dizzy, shaky, has blurred vision and no appetite due to her medication Keppra and Wellbutrin.

## 2017-11-14 ENCOUNTER — Ambulatory Visit (INDEPENDENT_AMBULATORY_CARE_PROVIDER_SITE_OTHER): Payer: Medicaid Other | Admitting: Obstetrics and Gynecology

## 2017-11-14 VITALS — BP 117/78 | HR 93 | Wt 194.3 lb

## 2017-11-14 DIAGNOSIS — F32A Depression, unspecified: Secondary | ICD-10-CM

## 2017-11-14 DIAGNOSIS — F418 Other specified anxiety disorders: Secondary | ICD-10-CM

## 2017-11-14 DIAGNOSIS — O34219 Maternal care for unspecified type scar from previous cesarean delivery: Secondary | ICD-10-CM

## 2017-11-14 DIAGNOSIS — O99352 Diseases of the nervous system complicating pregnancy, second trimester: Secondary | ICD-10-CM

## 2017-11-14 DIAGNOSIS — O0992 Supervision of high risk pregnancy, unspecified, second trimester: Secondary | ICD-10-CM

## 2017-11-14 DIAGNOSIS — G40909 Epilepsy, unspecified, not intractable, without status epilepticus: Secondary | ICD-10-CM

## 2017-11-14 DIAGNOSIS — O479 False labor, unspecified: Secondary | ICD-10-CM

## 2017-11-14 DIAGNOSIS — F329 Major depressive disorder, single episode, unspecified: Secondary | ICD-10-CM

## 2017-11-14 DIAGNOSIS — F419 Anxiety disorder, unspecified: Secondary | ICD-10-CM

## 2017-11-14 DIAGNOSIS — O99343 Other mental disorders complicating pregnancy, third trimester: Secondary | ICD-10-CM

## 2017-11-14 DIAGNOSIS — Z1379 Encounter for other screening for genetic and chromosomal anomalies: Secondary | ICD-10-CM

## 2017-11-14 NOTE — Progress Notes (Signed)
ROB- PT stated that she is doing well. No complaints.   

## 2017-11-14 NOTE — Progress Notes (Signed)
ROB: Patient complains of pelvic pressure and Braxton Hicks throughout the day.  Discussed use of her pregnancy garment (notes she typically uses at home and not at work although she stands most of the day at work).  Also encouraged adequate hydration as patient only drinks 1-2 bottles of water per day. Also offered note for job for work restrictions in pregnancy but she declined. Notes her job is going to take her out of work in October. Patient inquires if she can now consider a TOLAC, however discussed that based on her risk and number of C-sections she was not a candidate. Patient states that she is considering delivering at [facility].  Discussed that it was best for continuity of care to remain with one practice as she is high risk and will soon be entering her third trimester.  Now also desires genetic screening.  Offered cell-free DNA with [employer], which patient would like to do. Declined flu vaccine today. RTC in 3 weeks, for 1hr glucola, and ultrasound for growth as patient with seizure disorder on Keppra. To rescreen for depression and drug screen next visit as patient discontinued her Wellbutrin. Patient unable to void today.

## 2017-11-29 ENCOUNTER — Ambulatory Visit: Payer: Self-pay | Admitting: Neurology

## 2017-12-04 NOTE — Progress Notes (Signed)
Pt presents today for 28 week ROB. Pt received Tdap and signed Blood transfusion consent form.

## 2017-12-05 ENCOUNTER — Encounter: Payer: Self-pay | Admitting: Obstetrics and Gynecology

## 2017-12-05 ENCOUNTER — Ambulatory Visit (INDEPENDENT_AMBULATORY_CARE_PROVIDER_SITE_OTHER): Payer: Medicaid Other

## 2017-12-05 ENCOUNTER — Ambulatory Visit: Payer: Medicaid Other

## 2017-12-05 ENCOUNTER — Ambulatory Visit (INDEPENDENT_AMBULATORY_CARE_PROVIDER_SITE_OTHER): Payer: Medicaid Other | Admitting: Obstetrics and Gynecology

## 2017-12-05 VITALS — BP 109/62 | HR 84 | Wt 194.0 lb

## 2017-12-05 DIAGNOSIS — O99353 Diseases of the nervous system complicating pregnancy, third trimester: Secondary | ICD-10-CM

## 2017-12-05 DIAGNOSIS — O34219 Maternal care for unspecified type scar from previous cesarean delivery: Secondary | ICD-10-CM

## 2017-12-05 DIAGNOSIS — O0993 Supervision of high risk pregnancy, unspecified, third trimester: Secondary | ICD-10-CM

## 2017-12-05 DIAGNOSIS — G40909 Epilepsy, unspecified, not intractable, without status epilepticus: Secondary | ICD-10-CM | POA: Diagnosis not present

## 2017-12-05 DIAGNOSIS — O99352 Diseases of the nervous system complicating pregnancy, second trimester: Principal | ICD-10-CM

## 2017-12-05 DIAGNOSIS — Z3A28 28 weeks gestation of pregnancy: Secondary | ICD-10-CM

## 2017-12-05 DIAGNOSIS — Z3A27 27 weeks gestation of pregnancy: Secondary | ICD-10-CM

## 2017-12-05 LAB — POCT URINALYSIS DIPSTICK OB
BILIRUBIN UA: NEGATIVE
Blood, UA: NEGATIVE
Glucose, UA: NEGATIVE
KETONES UA: NEGATIVE
Leukocytes, UA: NEGATIVE
Nitrite, UA: NEGATIVE
SPEC GRAV UA: 1.02 (ref 1.010–1.025)
Urobilinogen, UA: 0.2 E.U./dL
pH, UA: 6 (ref 5.0–8.0)

## 2017-12-05 MED ORDER — TETANUS-DIPHTH-ACELL PERTUSSIS 5-2.5-18.5 LF-MCG/0.5 IM SUSP
0.5000 mL | Freq: Once | INTRAMUSCULAR | Status: AC
  Administered 2017-12-05: 0.5 mL via INTRAMUSCULAR

## 2017-12-05 NOTE — Progress Notes (Signed)
ROB: Doing 1 hour GCT today.  Ultrasound appears normal with 39th percentile growth.  Patient intermittently taking Keppra-advised her to do better with consistently taking.  C-section likely scheduled for December 9.  Discussed with patient.

## 2017-12-06 LAB — CBC
Hematocrit: 29.6 % — ABNORMAL LOW (ref 34.0–46.6)
Hemoglobin: 9.5 g/dL — ABNORMAL LOW (ref 11.1–15.9)
MCH: 28.7 pg (ref 26.6–33.0)
MCHC: 32.1 g/dL (ref 31.5–35.7)
MCV: 89 fL (ref 79–97)
Platelets: 230 10*3/uL (ref 150–450)
RBC: 3.31 x10E6/uL — ABNORMAL LOW (ref 3.77–5.28)
RDW: 13.1 % (ref 12.3–15.4)
WBC: 8.5 10*3/uL (ref 3.4–10.8)

## 2017-12-06 LAB — RPR: RPR: NONREACTIVE

## 2017-12-06 LAB — GLUCOSE TOLERANCE, 1 HOUR: Glucose, 1Hr PP: 125 mg/dL (ref 65–199)

## 2017-12-07 ENCOUNTER — Telehealth: Payer: Self-pay

## 2017-12-07 NOTE — Telephone Encounter (Signed)
Advised pt to increase iron intake with meals per DJE. Explained ways to do so, Pt understood.

## 2017-12-26 ENCOUNTER — Ambulatory Visit (INDEPENDENT_AMBULATORY_CARE_PROVIDER_SITE_OTHER): Payer: Medicaid Other | Admitting: Obstetrics and Gynecology

## 2017-12-26 VITALS — BP 122/78 | HR 101 | Wt 194.7 lb

## 2017-12-26 DIAGNOSIS — F419 Anxiety disorder, unspecified: Secondary | ICD-10-CM

## 2017-12-26 DIAGNOSIS — O34219 Maternal care for unspecified type scar from previous cesarean delivery: Secondary | ICD-10-CM

## 2017-12-26 DIAGNOSIS — F418 Other specified anxiety disorders: Secondary | ICD-10-CM

## 2017-12-26 DIAGNOSIS — F32A Depression, unspecified: Secondary | ICD-10-CM

## 2017-12-26 DIAGNOSIS — O99343 Other mental disorders complicating pregnancy, third trimester: Secondary | ICD-10-CM

## 2017-12-26 DIAGNOSIS — O0993 Supervision of high risk pregnancy, unspecified, third trimester: Secondary | ICD-10-CM

## 2017-12-26 DIAGNOSIS — G40909 Epilepsy, unspecified, not intractable, without status epilepticus: Secondary | ICD-10-CM

## 2017-12-26 DIAGNOSIS — F329 Major depressive disorder, single episode, unspecified: Secondary | ICD-10-CM

## 2017-12-26 DIAGNOSIS — F1211 Cannabis abuse, in remission: Secondary | ICD-10-CM

## 2017-12-26 DIAGNOSIS — Z98891 History of uterine scar from previous surgery: Secondary | ICD-10-CM

## 2017-12-26 DIAGNOSIS — O99210 Obesity complicating pregnancy, unspecified trimester: Secondary | ICD-10-CM

## 2017-12-26 DIAGNOSIS — O99213 Obesity complicating pregnancy, third trimester: Secondary | ICD-10-CM

## 2017-12-26 LAB — POCT URINALYSIS DIPSTICK OB
Bilirubin, UA: NEGATIVE
Blood, UA: NEGATIVE
Glucose, UA: NEGATIVE
KETONES UA: NEGATIVE
Leukocytes, UA: NEGATIVE
NITRITE UA: NEGATIVE
PH UA: 6.5 (ref 5.0–8.0)
Spec Grav, UA: 1.025 (ref 1.010–1.025)
UROBILINOGEN UA: 0.2 U/dL

## 2017-12-26 MED ORDER — SERTRALINE HCL 50 MG PO TABS: 50.0000 mg | ORAL_TABLET | Freq: Every day | ORAL | 6 refills | Status: AC

## 2017-12-26 MED ORDER — HYDROXYZINE PAMOATE 50 MG PO CAPS: 50.0000 mg | ORAL_CAPSULE | Freq: Every evening | ORAL | 1 refills | Status: AC | PRN

## 2017-12-26 NOTE — Progress Notes (Signed)
ROB: Patient reports that she is feeling a lot of [employer]. Admits that she has not been hydrating properly.  Notes that she is also feeling more agitated, as well as lacking interest in doing things anymore, including caring for her children (specifically helping with homework, cooking, cleaning, bathing) or for herself. Discussed that her depression was likely worsening. Is also having trouble sleeping and noting increasing anxiety.  Worried about some legal issues involving custody of her children. EPDS score today was 16.  Patient previously discontinued her Wellbutrin due to causing return of her seizures. Offered options again such as counseling, with or without medications. Patient declines counseling.  Offered for her to try Zoloft instead. Will prescribe 50 mg. Also will prescribe Vistaril for sleep and anxiety. Patient notes that she has also discontinued marijuana use 3 weeks ago. Normal 28 week labs. Discussed scheduling repeat C-section with [facility], will schedule for Nov. 9.  RTC in 2-3 weeks.

## 2017-12-26 NOTE — Progress Notes (Signed)
ROB-Pt stated that she is having a lot of contraction.  EPDS=16

## 2018-01-16 ENCOUNTER — Encounter: Payer: Self-pay | Admitting: Obstetrics and Gynecology

## 2018-01-16 ENCOUNTER — Ambulatory Visit (INDEPENDENT_AMBULATORY_CARE_PROVIDER_SITE_OTHER): Payer: Medicaid Other | Admitting: Obstetrics and Gynecology

## 2018-01-16 VITALS — BP 119/84 | HR 98 | Wt 196.0 lb

## 2018-01-16 DIAGNOSIS — F419 Anxiety disorder, unspecified: Secondary | ICD-10-CM

## 2018-01-16 DIAGNOSIS — F418 Other specified anxiety disorders: Secondary | ICD-10-CM

## 2018-01-16 DIAGNOSIS — O99343 Other mental disorders complicating pregnancy, third trimester: Secondary | ICD-10-CM

## 2018-01-16 DIAGNOSIS — F32A Depression, unspecified: Secondary | ICD-10-CM

## 2018-01-16 DIAGNOSIS — O0993 Supervision of high risk pregnancy, unspecified, third trimester: Secondary | ICD-10-CM

## 2018-01-16 DIAGNOSIS — F329 Major depressive disorder, single episode, unspecified: Secondary | ICD-10-CM

## 2018-01-16 DIAGNOSIS — F1210 Cannabis abuse, uncomplicated: Secondary | ICD-10-CM

## 2018-01-16 LAB — POCT URINALYSIS DIPSTICK OB
BILIRUBIN UA: NEGATIVE
Glucose, UA: NEGATIVE
KETONES UA: NEGATIVE
Leukocytes, UA: NEGATIVE
NITRITE UA: NEGATIVE
PH UA: 6.5 (ref 5.0–8.0)
PROTEIN: NEGATIVE
RBC UA: NEGATIVE
SPEC GRAV UA: 1.015 (ref 1.010–1.025)
UROBILINOGEN UA: 0.2 U/dL

## 2018-01-16 NOTE — Progress Notes (Signed)
ROB: Generally doing well.  She is taking her Zoloft intermittently but feels like it is working.  I have advised her to take it daily as prescribed.  Patient scheduled for repeat cesarean delivery on December 9.  All questions answered.  Needs cultures next visit.

## 2018-01-16 NOTE — Progress Notes (Signed)
Pt here today for ROB. Pt is doing well and has no concerns.

## 2018-01-17 LAB — DRUG PROFILE, UR, 9 DRUGS (LABCORP)
AMPHETAMINES, URINE: NEGATIVE ng/mL
BARBITURATE QUANT UR: NEGATIVE ng/mL
Benzodiazepine Quant, Ur: NEGATIVE ng/mL
COCAINE (METAB.): NEGATIVE ng/mL
Cannabinoid Quant, Ur: NEGATIVE ng/mL
METHADONE SCREEN, URINE: NEGATIVE ng/mL
OPIATE QUANT UR: NEGATIVE ng/mL
PCP Quant, Ur: NEGATIVE ng/mL
PROPOXYPHENE: NEGATIVE ng/mL

## 2018-01-19 ENCOUNTER — Telehealth: Payer: Self-pay | Admitting: Obstetrics and Gynecology

## 2018-01-19 NOTE — Telephone Encounter (Signed)
The patient was last seen by Dr. Logan Bores 01/16/18, and she states she is having back pain, almost like gallbladder pain, but she had her GB out.  Pain since 10 PM last night; sharp in nature.  Nauseous.  Not able to keep anything down yesterday, but today yes.  No fever, no vag discharge, no contractions, does not have leakage of fluid. The patient is asking to speak to her nurse as soon as possible due to the pain, please advise, thanks.

## 2018-01-19 NOTE — Telephone Encounter (Signed)
Pt called complaining of back and stomach pain. She stated she felt the urge to have a bowel movement but was unable to to. We discussed symptoms of constipation due to her iron intake. Advised the pt to increase her fiber intake and to try a laxative. Pt is to call back with any concerns.

## 2018-01-30 ENCOUNTER — Ambulatory Visit (INDEPENDENT_AMBULATORY_CARE_PROVIDER_SITE_OTHER): Payer: Medicaid Other | Admitting: Obstetrics and Gynecology

## 2018-01-30 VITALS — BP 127/85 | HR 82 | Wt 199.1 lb

## 2018-01-30 DIAGNOSIS — F17201 Nicotine dependence, unspecified, in remission: Secondary | ICD-10-CM

## 2018-01-30 DIAGNOSIS — O34219 Maternal care for unspecified type scar from previous cesarean delivery: Secondary | ICD-10-CM

## 2018-01-30 DIAGNOSIS — Z98891 History of uterine scar from previous surgery: Secondary | ICD-10-CM

## 2018-01-30 DIAGNOSIS — F329 Major depressive disorder, single episode, unspecified: Secondary | ICD-10-CM

## 2018-01-30 DIAGNOSIS — Z62810 Personal history of physical and sexual abuse in childhood: Secondary | ICD-10-CM | POA: Insufficient documentation

## 2018-01-30 DIAGNOSIS — O26843 Uterine size-date discrepancy, third trimester: Secondary | ICD-10-CM

## 2018-01-30 DIAGNOSIS — F419 Anxiety disorder, unspecified: Secondary | ICD-10-CM

## 2018-01-30 DIAGNOSIS — F32A Depression, unspecified: Secondary | ICD-10-CM

## 2018-01-30 DIAGNOSIS — O0993 Supervision of high risk pregnancy, unspecified, third trimester: Secondary | ICD-10-CM

## 2018-01-30 LAB — POCT URINALYSIS DIPSTICK OB
BILIRUBIN UA: NEGATIVE
Blood, UA: NEGATIVE
Glucose, UA: NEGATIVE
KETONES UA: NEGATIVE
Leukocytes, UA: NEGATIVE
Nitrite, UA: NEGATIVE
PH UA: 7 (ref 5.0–8.0)
Spec Grav, UA: 1.02 (ref 1.010–1.025)
Urobilinogen, UA: 0.2 E.U./dL
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
## 2018-01-30 NOTE — Progress Notes (Signed)
ROB-Pt stated that she is having really bad back pain and headaches. No other concerns.

## 2018-02-01 LAB — STREP GP B NAA: Strep Gp B NAA: NEGATIVE

## 2018-02-02 LAB — GC/CHLAMYDIA PROBE AMP
Chlamydia trachomatis, NAA: NEGATIVE
Neisseria gonorrhoeae by PCR: NEGATIVE

## 2018-02-06 ENCOUNTER — Ambulatory Visit (INDEPENDENT_AMBULATORY_CARE_PROVIDER_SITE_OTHER): Payer: Medicaid Other

## 2018-02-06 ENCOUNTER — Observation Stay
Admission: EM | Admit: 2018-02-06 | Discharge: 2018-02-06 | Disposition: A | Discharge status: Home / Self Care | Payer: Medicaid Other | Attending: Obstetrics and Gynecology | Admitting: Obstetrics and Gynecology

## 2018-02-06 ENCOUNTER — Ambulatory Visit (INDEPENDENT_AMBULATORY_CARE_PROVIDER_SITE_OTHER): Payer: Medicaid Other | Admitting: Obstetrics and Gynecology

## 2018-02-06 ENCOUNTER — Encounter: Payer: Self-pay | Admitting: Obstetrics and Gynecology

## 2018-02-06 ENCOUNTER — Other Ambulatory Visit: Payer: Self-pay

## 2018-02-06 VITALS — BP 135/82 | HR 92 | Wt 201.0 lb

## 2018-02-06 DIAGNOSIS — O09293 Supervision of pregnancy with other poor reproductive or obstetric history, third trimester: Secondary | ICD-10-CM | POA: Diagnosis not present

## 2018-02-06 DIAGNOSIS — O26893 Other specified pregnancy related conditions, third trimester: Secondary | ICD-10-CM | POA: Diagnosis not present

## 2018-02-06 DIAGNOSIS — R569 Unspecified convulsions: Secondary | ICD-10-CM | POA: Diagnosis not present

## 2018-02-06 DIAGNOSIS — O26843 Uterine size-date discrepancy, third trimester: Secondary | ICD-10-CM

## 2018-02-06 DIAGNOSIS — O1493 Unspecified pre-eclampsia, third trimester: Secondary | ICD-10-CM | POA: Diagnosis present

## 2018-02-06 DIAGNOSIS — Z3A37 37 weeks gestation of pregnancy: Secondary | ICD-10-CM

## 2018-02-06 DIAGNOSIS — O0993 Supervision of high risk pregnancy, unspecified, third trimester: Secondary | ICD-10-CM

## 2018-02-06 DIAGNOSIS — R51 Headache: Principal | ICD-10-CM | POA: Insufficient documentation

## 2018-02-06 LAB — COMPREHENSIVE METABOLIC PANEL
ALT: 13 U/L (ref 0–44)
AST: 17 U/L (ref 15–41)
Albumin: 3.1 g/dL — ABNORMAL LOW (ref 3.5–5.0)
Alkaline Phosphatase: 88 U/L (ref 38–126)
Anion gap: 7 (ref 5–15)
BUN: 9 mg/dL (ref 6–20)
CHLORIDE: 107 mmol/L (ref 98–111)
CO2: 24 mmol/L (ref 22–32)
CREATININE: 0.69 mg/dL (ref 0.44–1.00)
Calcium: 9 mg/dL (ref 8.9–10.3)
Glucose, Bld: 129 mg/dL — ABNORMAL HIGH (ref 70–99)
POTASSIUM: 3.6 mmol/L (ref 3.5–5.1)
SODIUM: 138 mmol/L (ref 135–145)
Total Bilirubin: 0.3 mg/dL (ref 0.3–1.2)
Total Protein: 6.8 g/dL (ref 6.5–8.1)

## 2018-02-06 LAB — POCT URINALYSIS DIPSTICK OB
BILIRUBIN UA: NEGATIVE
Blood, UA: NEGATIVE
GLUCOSE, UA: NEGATIVE
KETONES UA: NEGATIVE
Leukocytes, UA: NEGATIVE
PH UA: 6.5 (ref 5.0–8.0)
Spec Grav, UA: 1.02 (ref 1.010–1.025)
UROBILINOGEN UA: 0.2 U/dL

## 2018-02-06 LAB — PROTEIN / CREATININE RATIO, URINE
CREATININE, URINE: 259 mg/dL
Protein Creatinine Ratio: 0.34 mg/mg{Cre} — ABNORMAL HIGH (ref 0.00–0.15)
TOTAL PROTEIN, URINE: 88 mg/dL

## 2018-02-06 MED ORDER — ACETAMINOPHEN 500 MG PO TABS
ORAL_TABLET | ORAL | Status: AC
  Administered 2018-02-06: 1000 mg via ORAL
  Filled 2018-02-06: qty 2

## 2018-02-06 MED ORDER — ACETAMINOPHEN 500 MG PO TABS
1000.0000 mg | ORAL_TABLET | Freq: Once | ORAL | Status: AC
  Administered 2018-02-06: 1000 mg via ORAL

## 2018-02-06 NOTE — Progress Notes (Signed)
ROB: Patient without severe risk preeclamptic symptoms.  Reports fetal movement-discussed daily fetal kick counts.  Ultrasound today reveals 22nd percentile growth with normal AFI.  Signs and symptoms of labor discussed patient to inform us if she begins having regular contractions.  Preeclamptic warnings given.  Urine for UPC sent today. (Significant urinary protein with normal blood pressures) patient does have a significant risk for preeclampsia as her previous pregnancy ended in an ICU stay after postpartum preeclampsia.

## 2018-02-06 NOTE — Progress Notes (Signed)
Pt here for ROB today. Pt states she has noticed very slight spotting. Pt is feeling well and has no concerns right now.

## 2018-02-06 NOTE — OB Triage Note (Addendum)
Pt is a 27yo G6 P3 at 3714w1d that presents from ED for Preeclampsia eval. Pt has a hx of c/s x3 and hx of seizures with last one being about a month ago. Pt states she hardly takes her seizure medicine. Pt states she has had a headache since 1230 today but has not taken medications for it. Pt states she's had blurry vision with the headache. Pt denies Epigastric pain, VB, LOF and states positive FM. Initial BP 119/89 with them cycling q 15 min. Pt has plus 2 reflexes with no clonus and clear lungs on assessment. Initial FHT 150 with monitors applied and assessing.

## 2018-02-06 NOTE — Progress Notes (Signed)
Provider notified of pt lab results. Dr. Valentino Saxonherry d/c the patient home in stable condition. All vitals WDL and discharge instructions reviewed with patient. Pt verbalized understanding of her d/c instructions and verbalized understanding of when to return to the ED for evaluation.

## 2018-02-06 NOTE — Final Progress Note (Addendum)
L&D OB Triage Note  Emily MannersChevy C Raper is a 27 y.o. [id number] female at 4179w1d, EDD Estimated Date of Delivery: 02/26/18 who presented to triage for complaints of *"needing a pre-eclampsia evaluation".  She has a h/o seizures, with last one being about 1 month ago.  She reports she is worried as she has a h/o pre-eclampsia in her last pregnancy, and had protein in her urine today at her office visit.  Her blood pressures have been normal, but patient now complains of headache with onset today.  She has not taken anything for the headaches.  She was evaluated by the nurses with no significant findings for pre-eclampsia at this time. . Vital signs stable. An NST was performed and has been reviewed by MD. She was treated with PO Tylenol 1000 mg.    Physical Exam:  Vitals:   02/06/18 1458 02/06/18 1513 02/06/18 1528 02/06/18 1543  BP: 113/61 118/61 (!) 103/45 (!) 101/50  Pulse: 93 92 95 99  Resp:    16  Temp:    98 F (36.7 C)  TempSrc:    Oral  Weight:      Height:        NST INTERPRETATION: Indications: rule out uterine contractions  Mode: External Baseline Rate (A): 145 bpm Variability: Moderate Accelerations: 15 x 15 Decelerations: None     Contraction Frequency (min): none w UI  Impression: nonreactive    Labs:  Results for orders placed or performed during the hospital encounter of 02/06/18  Comprehensive metabolic panel  Result Value Ref Range   Sodium 138 135 - 145 mmol/L   Potassium 3.6 3.5 - 5.1 mmol/L   Chloride 107 98 - 111 mmol/L   CO2 24 22 - 32 mmol/L   Glucose, Bld 129 (H) 70 - 99 mg/dL   BUN 9 6 - 20 mg/dL   Creatinine, Ser 4.540.69 0.44 - 1.00 mg/dL   Calcium 9.0 8.9 - 09.810.3 mg/dL   Total Protein 6.8 6.5 - 8.1 g/dL   Albumin 3.1 (L) 3.5 - 5.0 g/dL   AST 17 15 - 41 U/L   ALT 13 0 - 44 U/L   Alkaline Phosphatase 88 38 - 126 U/L   Total Bilirubin 0.3 0.3 - 1.2 mg/dL   GFR calc non Af Amer >60 >60 mL/min   GFR calc Af Amer >60 >60 mL/min   Anion gap 7 5 - 15   Protein / creatinine ratio, urine  Result Value Ref Range   Creatinine, Urine 259 mg/dL   Total Protein, Urine 88 mg/dL   Protein Creatinine Ratio 0.34 (H) 0.00 - 0.15 mg/mg[Cre]    Plan: NST performed was reviewed and was found to be reactive. She was discharged home with pre-eclampsia precautions.  Continue routine prenatal care. Follow up with OB/GYN as previously scheduled.     Hildred LaserAnika Charletta Voight, MD [facility]

## 2018-02-07 LAB — PROTEIN / CREATININE RATIO, URINE
Creatinine, Urine: 238.4 mg/dL
Protein, Ur: 69.2 mg/dL
Protein/Creat Ratio: 290 mg/g creat — ABNORMAL HIGH (ref 0–200)

## 2018-02-11 DIAGNOSIS — R569 Unspecified convulsions: Secondary | ICD-10-CM

## 2018-02-11 DIAGNOSIS — E119 Type 2 diabetes mellitus without complications: Secondary | ICD-10-CM

## 2018-02-11 HISTORY — DX: Unspecified convulsions: R56.9

## 2018-02-11 HISTORY — DX: Type 2 diabetes mellitus without complications: E11.9

## 2018-02-13 ENCOUNTER — Encounter: Payer: Medicaid Other | Admitting: Obstetrics and Gynecology

## 2018-02-13 ENCOUNTER — Ambulatory Visit (INDEPENDENT_AMBULATORY_CARE_PROVIDER_SITE_OTHER): Payer: Medicaid Other | Admitting: Obstetrics and Gynecology

## 2018-02-13 VITALS — BP 128/81 | HR 105 | Wt 204.4 lb

## 2018-02-13 DIAGNOSIS — O34219 Maternal care for unspecified type scar from previous cesarean delivery: Secondary | ICD-10-CM

## 2018-02-13 DIAGNOSIS — O1213 Gestational proteinuria, third trimester: Secondary | ICD-10-CM

## 2018-02-13 DIAGNOSIS — Z98891 History of uterine scar from previous surgery: Secondary | ICD-10-CM

## 2018-02-13 DIAGNOSIS — O09293 Supervision of pregnancy with other poor reproductive or obstetric history, third trimester: Secondary | ICD-10-CM

## 2018-02-13 LAB — POCT URINALYSIS DIPSTICK OB
BILIRUBIN UA: NEGATIVE
GLUCOSE, UA: NEGATIVE
Ketones, UA: NEGATIVE
Leukocytes, UA: NEGATIVE
Nitrite, UA: NEGATIVE
RBC UA: NEGATIVE
SPEC GRAV UA: 1.02 (ref 1.010–1.025)
Urobilinogen, UA: 0.2 E.U./dL
pH, UA: 7 (ref 5.0–8.0)

## 2018-02-13 NOTE — Progress Notes (Signed)
ROB-Pt stated that she is doing well no complaints.  

## 2018-02-13 NOTE — Patient Instructions (Signed)
Cesarean Delivery Cesarean birth, or cesarean delivery, is the surgical delivery of a baby through an incision in the abdomen and the uterus. This may be referred to as a C-section. This procedure may be scheduled ahead of time, or it may be done in an emergency situation. Tell a health care provider about:  Any allergies you have.  All medicines you are taking, including vitamins, herbs, eye drops, creams, and over-the-counter medicines.  Any problems you or family members have had with anesthetic medicines.  Any blood disorders you have.  Any surgeries you have had.  Any medical conditions you have.  Whether you or any members of your family have a history of deep vein thrombosis (DVT) or pulmonary embolism (PE). What are the risks? Generally, this is a safe procedure. However, problems may occur, including:  Infection.  Bleeding.  Allergic reactions to medicines.  Damage to other structures or organs.  Blood clots.  Injury to your baby.  What happens before the procedure?  Follow instructions from your health care provider about eating or drinking restrictions.  Follow instructions from your health care provider about bathing before your procedure to help reduce your risk of infection.  If you know that you are going to have a cesarean delivery, do not shave your pubic area. Shaving before the procedure may increase your risk of infection.  Ask your health care provider about: ? Changing or stopping your regular medicines. This is especially important if you are taking diabetes medicines or blood thinners. ? Your pain management plan. This is especially important if you plan to breastfeed your baby. ? How long you will be in the hospital after the procedure. ? Any concerns you may have about receiving blood products if you need them during the procedure. ? Cord blood banking, if you plan to collect your baby's umbilical cord blood.  You may also want to ask your  health care provider: ? Whether you will be able to hold or breastfeed your baby while you are still in the operating room. ? Whether your baby can stay with you immediately after the procedure and during your recovery. ? Whether a family member or a person of your choice can go with you into the operating room and stay with you during the procedure, immediately after the procedure, and during your recovery.  Plan to have someone drive you home when you are discharged from the hospital. What happens during the procedure?  Fetal monitors will be placed on your abdomen to monitor your heart rate and your baby's heart rate.  Depending on the reason for your cesarean delivery, you may have a physical exam or additional testing, such as an ultrasound.  An IV tube will be inserted into one of your veins.  You may have your blood or urine tested.  You will be given antibiotic medicine to help prevent infection.  You may be given a special warming gown to wear to keep your temperature stable.  Hair may be removed from your pubic area.  The skin of your pubic area and lower abdomen will be cleaned with a germ-killing solution (antiseptic).  A catheter may be inserted into your bladder through your urethra. This drains your urine during the procedure.  You may be given one or more of the following: ? A medicine to numb the area (local anesthetic). ? A medicine to make you fall asleep (general anesthetic). ? A medicine (regional anesthetic) that is injected into your back or through a small   thin tube placed in your back (spinal anesthetic or epidural anesthetic). This numbs everything below the injection site and allows you to stay awake during your procedure. If this makes you feel nauseous, tell your health care provider. Medicines will be available to help reduce any nausea you may feel.  An incision will be made in your abdomen, and then in your uterus.  If you are awake during your  procedure, you may feel tugging and pulling in your abdomen, but you should not feel pain. If you feel pain, tell your health care provider immediately.  Your baby will be removed from your uterus. You may feel more pressure or pushing while this happens.  Immediately after birth, your baby will be dried and kept warm. You may be able to hold and breastfeed your baby. The umbilical cord may be clamped and cut during this time.  Your placenta will be removed from your uterus.  Your incisions will be closed with stitches (sutures). Staples, skin glue, or adhesive strips may also be applied to the incision in your abdomen.  Bandages (dressings) will be placed over the incision in your abdomen. The procedure may vary among health care providers and hospitals. What happens after the procedure?  Your blood pressure, heart rate, breathing rate, and blood oxygen level will be monitored often until the medicines you were given have worn off.  You may continue to receive fluids and medicines through an IV tube.  You will have some pain. Medicines will be available to help control your pain.  To help prevent blood clots: ? You may be given medicines. ? You may have to wear compression stockings or devices. ? You will be encouraged to walk around when you are able.  Hospital staff will encourage and support bonding with your baby. Your hospital may allow you and your baby to stay in the same room (rooming in) during your hospital stay to encourage successful breastfeeding.  You may be encouraged to cough and breathe deeply often. This helps to prevent lung problems.  If you have a catheter draining your urine, it will be removed as soon as possible after your procedure. This information is not intended to replace advice given to you by your health care provider. Make sure you discuss any questions you have with your health care provider. Document Released: 02/28/2005 Document Revised: 08/06/2015  Document Reviewed: 12/09/2014 Elsevier Interactive Patient Education  2018 [employer]  

## 2018-02-13 NOTE — Progress Notes (Signed)
ROB: Patient complains of pelvic pressure, and discomfort after urination. UA today wnl, except continued proteinuria. Given reassurance again regarding concerns for pre-eclampsia. BP normal. Discussed upcoming C-section Monday, pre-op done today.

## 2018-02-16 ENCOUNTER — Other Ambulatory Visit: Payer: Self-pay

## 2018-02-16 ENCOUNTER — Encounter
Admission: RE | Admit: 2018-02-16 | Discharge: 2018-02-16 | Disposition: A | Discharge status: Home / Self Care | Payer: Medicaid Other | Source: Ambulatory Visit | Attending: Obstetrics and Gynecology | Admitting: Obstetrics and Gynecology

## 2018-02-16 DIAGNOSIS — Z01812 Encounter for preprocedural laboratory examination: Secondary | ICD-10-CM | POA: Insufficient documentation

## 2018-02-16 HISTORY — DX: Gastro-esophageal reflux disease without esophagitis: K21.9

## 2018-02-16 LAB — CBC
HCT: 31.7 % — ABNORMAL LOW (ref 36.0–46.0)
HEMOGLOBIN: 10 g/dL — AB (ref 12.0–15.0)
MCH: 27.8 pg (ref 26.0–34.0)
MCHC: 31.5 g/dL (ref 30.0–36.0)
MCV: 88.1 fL (ref 80.0–100.0)
PLATELETS: 247 10*3/uL (ref 150–400)
RBC: 3.6 MIL/uL — AB (ref 3.87–5.11)
RDW: 13.8 % (ref 11.5–15.5)
WBC: 9.2 10*3/uL (ref 4.0–10.5)
nRBC: 0 % (ref 0.0–0.2)

## 2018-02-16 LAB — TYPE AND SCREEN
ABO/RH(D): O POS
ANTIBODY SCREEN: NEGATIVE
Extend sample reason: UNDETERMINED

## 2018-02-16 NOTE — Pre-Procedure Instructions (Signed)
Patient states that she has stopped several of her meds approximately 2 to 3 months ago (Keppra and Zoloft and Prenatals) because she did not like how they made her feel or she felt she didn't need the medicines.

## 2018-02-17 LAB — RPR: RPR Ser Ql: NONREACTIVE

## 2018-02-18 MED ORDER — CEFAZOLIN SODIUM-DEXTROSE 2-4 GM/100ML-% IV SOLN
2.0000 g | INTRAVENOUS | Status: AC
  Administered 2018-02-19: 2 g via INTRAVENOUS
  Filled 2018-02-18 (×2): qty 100

## 2018-02-19 ENCOUNTER — Inpatient Hospital Stay: Payer: Medicaid Other | Admitting: Anesthesiology

## 2018-02-19 ENCOUNTER — Encounter
Admission: RE | Disposition: A | Discharge status: Home / Self Care | Payer: Self-pay | Source: Home / Self Care | Attending: Obstetrics and Gynecology

## 2018-02-19 ENCOUNTER — Inpatient Hospital Stay
Admission: RE | Admit: 2018-02-19 | Discharge: 2018-02-22 | DRG: 784 | Disposition: A | Discharge status: Home / Self Care | Payer: Medicaid Other | Attending: Obstetrics and Gynecology | Admitting: Obstetrics and Gynecology

## 2018-02-19 ENCOUNTER — Other Ambulatory Visit: Payer: Self-pay

## 2018-02-19 DIAGNOSIS — O0993 Supervision of high risk pregnancy, unspecified, third trimester: Secondary | ICD-10-CM

## 2018-02-19 DIAGNOSIS — O99324 Drug use complicating childbirth: Secondary | ICD-10-CM | POA: Diagnosis present

## 2018-02-19 DIAGNOSIS — F17210 Nicotine dependence, cigarettes, uncomplicated: Secondary | ICD-10-CM | POA: Diagnosis present

## 2018-02-19 DIAGNOSIS — F418 Other specified anxiety disorders: Secondary | ICD-10-CM

## 2018-02-19 DIAGNOSIS — O34211 Maternal care for low transverse scar from previous cesarean delivery: Principal | ICD-10-CM

## 2018-02-19 DIAGNOSIS — G40909 Epilepsy, unspecified, not intractable, without status epilepticus: Secondary | ICD-10-CM

## 2018-02-19 DIAGNOSIS — O9081 Anemia of the puerperium: Secondary | ICD-10-CM | POA: Diagnosis not present

## 2018-02-19 DIAGNOSIS — F1210 Cannabis abuse, uncomplicated: Secondary | ICD-10-CM | POA: Diagnosis present

## 2018-02-19 DIAGNOSIS — F1211 Cannabis abuse, in remission: Secondary | ICD-10-CM

## 2018-02-19 DIAGNOSIS — Z302 Encounter for sterilization: Secondary | ICD-10-CM

## 2018-02-19 DIAGNOSIS — O99210 Obesity complicating pregnancy, unspecified trimester: Secondary | ICD-10-CM | POA: Diagnosis present

## 2018-02-19 DIAGNOSIS — F419 Anxiety disorder, unspecified: Secondary | ICD-10-CM | POA: Diagnosis present

## 2018-02-19 DIAGNOSIS — O9902 Anemia complicating childbirth: Secondary | ICD-10-CM

## 2018-02-19 DIAGNOSIS — E669 Obesity, unspecified: Secondary | ICD-10-CM | POA: Diagnosis present

## 2018-02-19 DIAGNOSIS — O99334 Smoking (tobacco) complicating childbirth: Secondary | ICD-10-CM | POA: Diagnosis present

## 2018-02-19 DIAGNOSIS — Z3A39 39 weeks gestation of pregnancy: Secondary | ICD-10-CM

## 2018-02-19 DIAGNOSIS — F329 Major depressive disorder, single episode, unspecified: Secondary | ICD-10-CM | POA: Diagnosis present

## 2018-02-19 DIAGNOSIS — O99013 Anemia complicating pregnancy, third trimester: Secondary | ICD-10-CM

## 2018-02-19 DIAGNOSIS — O99214 Obesity complicating childbirth: Secondary | ICD-10-CM

## 2018-02-19 DIAGNOSIS — F17201 Nicotine dependence, unspecified, in remission: Secondary | ICD-10-CM

## 2018-02-19 DIAGNOSIS — Z720 Tobacco use: Secondary | ICD-10-CM | POA: Diagnosis present

## 2018-02-19 DIAGNOSIS — Z98891 History of uterine scar from previous surgery: Secondary | ICD-10-CM

## 2018-02-19 DIAGNOSIS — O99344 Other mental disorders complicating childbirth: Secondary | ICD-10-CM

## 2018-02-19 DIAGNOSIS — O99354 Diseases of the nervous system complicating childbirth: Secondary | ICD-10-CM | POA: Diagnosis present

## 2018-02-19 DIAGNOSIS — F32A Depression, unspecified: Secondary | ICD-10-CM

## 2018-02-19 LAB — URINE DRUG SCREEN, QUALITATIVE (ARMC ONLY)
Amphetamines, Ur Screen: NOT DETECTED
Barbiturates, Ur Screen: NOT DETECTED
Benzodiazepine, Ur Scrn: NOT DETECTED
CANNABINOID 50 NG, UR ~~LOC~~: NOT DETECTED
Cocaine Metabolite,Ur ~~LOC~~: NOT DETECTED
MDMA (Ecstasy)Ur Screen: NOT DETECTED
Methadone Scn, Ur: NOT DETECTED
Opiate, Ur Screen: NOT DETECTED
Phencyclidine (PCP) Ur S: NOT DETECTED
Tricyclic, Ur Screen: NOT DETECTED

## 2018-02-19 SURGERY — Surgical Case
Anesthesia: Spinal | Site: Abdomen | Laterality: Bilateral

## 2018-02-19 MED ORDER — MEASLES, MUMPS & RUBELLA VAC IJ SOLR
0.5000 mL | Freq: Once | INTRAMUSCULAR | Status: DC
  Filled 2018-02-19: qty 0.5

## 2018-02-19 MED ORDER — KETOROLAC TROMETHAMINE 30 MG/ML IJ SOLN
30.0000 mg | Freq: Four times a day (QID) | INTRAMUSCULAR | Status: AC
  Administered 2018-02-20 (×2): 30 mg via INTRAVENOUS
  Filled 2018-02-19: qty 1

## 2018-02-19 MED ORDER — ACETAMINOPHEN 325 MG PO TABS
650.0000 mg | ORAL_TABLET | ORAL | Status: DC | PRN
  Administered 2018-02-19 (×2): 650 mg via ORAL

## 2018-02-19 MED ORDER — ONDANSETRON HCL 4 MG/2ML IJ SOLN: 4.0000 mg | Freq: Three times a day (TID) | INTRAMUSCULAR | Status: DC | PRN

## 2018-02-19 MED ORDER — SOD CITRATE-CITRIC ACID 500-334 MG/5ML PO SOLN
30.0000 mL | ORAL | Status: AC
  Administered 2018-02-19: 30 mL via ORAL
  Filled 2018-02-19: qty 15

## 2018-02-19 MED ORDER — FENTANYL CITRATE (PF) 100 MCG/2ML IJ SOLN
INTRAMUSCULAR | Status: AC
  Filled 2018-02-19: qty 2

## 2018-02-19 MED ORDER — LACTATED RINGERS IV BOLUS
1000.0000 mL | Freq: Once | INTRAVENOUS | Status: AC
  Administered 2018-02-19: 1000 mL via INTRAVENOUS

## 2018-02-19 MED ORDER — BUPIVACAINE IN DEXTROSE 0.75-8.25 % IT SOLN
INTRATHECAL | Status: DC | PRN
  Administered 2018-02-19: 1.6 mL via INTRATHECAL

## 2018-02-19 MED ORDER — DIPHENHYDRAMINE HCL 50 MG/ML IJ SOLN: 12.5000 mg | INTRAMUSCULAR | Status: DC | PRN

## 2018-02-19 MED ORDER — NALBUPHINE HCL 10 MG/ML IJ SOLN
5.0000 mg | Freq: Once | INTRAMUSCULAR | Status: AC | PRN
  Administered 2018-02-19: 5 mg via INTRAVENOUS

## 2018-02-19 MED ORDER — MENTHOL 3 MG MT LOZG
1.0000 | LOZENGE | OROMUCOSAL | Status: DC | PRN
  Filled 2018-02-19: qty 9

## 2018-02-19 MED ORDER — KETOROLAC TROMETHAMINE 30 MG/ML IJ SOLN
30.0000 mg | Freq: Four times a day (QID) | INTRAMUSCULAR | Status: DC
  Administered 2018-02-19 (×2): 30 mg via INTRAVENOUS
  Filled 2018-02-19 (×3): qty 1

## 2018-02-19 MED ORDER — NALBUPHINE HCL 10 MG/ML IJ SOLN: 5.0000 mg | INTRAMUSCULAR | Status: DC | PRN

## 2018-02-19 MED ORDER — SODIUM CHLORIDE 0.9 % IV SOLN
INTRAVENOUS | Status: DC | PRN
  Administered 2018-02-19: 30 ug/min via INTRAVENOUS

## 2018-02-19 MED ORDER — PRENATAL MULTIVITAMIN CH
1.0000 | ORAL_TABLET | Freq: Every day | ORAL | Status: DC
  Administered 2018-02-19 – 2018-02-22 (×4): 1 via ORAL
  Filled 2018-02-19 (×4): qty 1

## 2018-02-19 MED ORDER — DIBUCAINE 1 % RE OINT: 1.0000 "application " | TOPICAL_OINTMENT | RECTAL | Status: DC | PRN

## 2018-02-19 MED ORDER — MAGNESIUM HYDROXIDE 400 MG/5ML PO SUSP: 30.0000 mL | ORAL | Status: DC | PRN

## 2018-02-19 MED ORDER — ACETAMINOPHEN 325 MG PO TABS
650.0000 mg | ORAL_TABLET | Freq: Four times a day (QID) | ORAL | Status: DC
  Administered 2018-02-20: 650 mg via ORAL
  Filled 2018-02-19 (×4): qty 2

## 2018-02-19 MED ORDER — LACTATED RINGERS IV SOLN: INTRAVENOUS | Status: DC

## 2018-02-19 MED ORDER — DIPHENHYDRAMINE HCL 25 MG PO CAPS: 25.0000 mg | ORAL_CAPSULE | ORAL | Status: DC | PRN

## 2018-02-19 MED ORDER — LIDOCAINE 5 % EX PTCH
1.0000 | MEDICATED_PATCH | CUTANEOUS | Status: DC
  Administered 2018-02-20 – 2018-02-22 (×3): 1 via TRANSDERMAL
  Filled 2018-02-19 (×3): qty 1

## 2018-02-19 MED ORDER — FERROUS SULFATE 325 (65 FE) MG PO TABS
325.0000 mg | ORAL_TABLET | Freq: Two times a day (BID) | ORAL | Status: DC
  Administered 2018-02-19 – 2018-02-22 (×6): 325 mg via ORAL
  Filled 2018-02-19 (×6): qty 1

## 2018-02-19 MED ORDER — SIMETHICONE 80 MG PO CHEW
80.0000 mg | CHEWABLE_TABLET | ORAL | Status: DC | PRN
  Administered 2018-02-19: 80 mg via ORAL
  Filled 2018-02-19: qty 1

## 2018-02-19 MED ORDER — OXYTOCIN 40 UNITS IN LACTATED RINGERS INFUSION - SIMPLE MED
2.5000 [IU]/h | INTRAVENOUS | Status: DC
  Administered 2018-02-19: 2.5 [IU]/h via INTRAVENOUS
  Filled 2018-02-19: qty 1000

## 2018-02-19 MED ORDER — ZOLPIDEM TARTRATE 5 MG PO TABS: 5.0000 mg | ORAL_TABLET | Freq: Every evening | ORAL | Status: DC | PRN

## 2018-02-19 MED ORDER — NALBUPHINE HCL 10 MG/ML IJ SOLN
INTRAMUSCULAR | Status: AC
  Filled 2018-02-19: qty 1

## 2018-02-19 MED ORDER — PHENYLEPHRINE HCL 10 MG/ML IJ SOLN
INTRAMUSCULAR | Status: AC
  Filled 2018-02-19: qty 1

## 2018-02-19 MED ORDER — DIPHENHYDRAMINE HCL 25 MG PO CAPS
25.0000 mg | ORAL_CAPSULE | Freq: Four times a day (QID) | ORAL | Status: DC | PRN
  Administered 2018-02-22: 25 mg via ORAL
  Filled 2018-02-19: qty 1

## 2018-02-19 MED ORDER — OXYTOCIN 40 UNITS IN LACTATED RINGERS INFUSION - SIMPLE MED
INTRAVENOUS | Status: DC | PRN
  Administered 2018-02-19: 1000 mL via INTRAVENOUS

## 2018-02-19 MED ORDER — MORPHINE SULFATE (PF) 0.5 MG/ML IJ SOLN
INTRAMUSCULAR | Status: DC | PRN
  Administered 2018-02-19: .2 mg via EPIDURAL

## 2018-02-19 MED ORDER — WITCH HAZEL-GLYCERIN EX PADS: 1.0000 "application " | MEDICATED_PAD | CUTANEOUS | Status: DC | PRN

## 2018-02-19 MED ORDER — LIDOCAINE 5 % EX PTCH
MEDICATED_PATCH | CUTANEOUS | Status: DC | PRN
  Administered 2018-02-19: 1 via TRANSDERMAL

## 2018-02-19 MED ORDER — IBUPROFEN 800 MG PO TABS
800.0000 mg | ORAL_TABLET | Freq: Four times a day (QID) | ORAL | Status: DC
  Administered 2018-02-20 – 2018-02-22 (×8): 800 mg via ORAL
  Filled 2018-02-19 (×9): qty 1

## 2018-02-19 MED ORDER — FUROSEMIDE 10 MG/ML IJ SOLN
20.0000 mg | Freq: Once | INTRAMUSCULAR | Status: AC
  Administered 2018-02-19: 20 mg via INTRAVENOUS
  Filled 2018-02-19: qty 2

## 2018-02-19 MED ORDER — MORPHINE SULFATE (PF) 0.5 MG/ML IJ SOLN
INTRAMUSCULAR | Status: AC
  Filled 2018-02-19: qty 10

## 2018-02-19 MED ORDER — NALBUPHINE HCL 10 MG/ML IJ SOLN: 5.0000 mg | Freq: Once | INTRAMUSCULAR | Status: AC | PRN

## 2018-02-19 MED ORDER — SENNOSIDES-DOCUSATE SODIUM 8.6-50 MG PO TABS
2.0000 | ORAL_TABLET | ORAL | Status: DC
  Administered 2018-02-20 – 2018-02-22 (×3): 2 via ORAL
  Filled 2018-02-19 (×4): qty 2

## 2018-02-19 MED ORDER — ENOXAPARIN SODIUM 40 MG/0.4ML ~~LOC~~ SOLN
40.0000 mg | SUBCUTANEOUS | Status: DC
  Administered 2018-02-20 – 2018-02-22 (×3): 40 mg via SUBCUTANEOUS
  Filled 2018-02-19 (×5): qty 0.4

## 2018-02-19 MED ORDER — OXYTOCIN 40 UNITS IN LACTATED RINGERS INFUSION - SIMPLE MED
INTRAVENOUS | Status: AC
  Filled 2018-02-19: qty 1000

## 2018-02-19 MED ORDER — COCONUT OIL OIL
1.0000 "application " | TOPICAL_OIL | Status: DC | PRN
  Filled 2018-02-19: qty 120

## 2018-02-19 MED ORDER — OXYCODONE HCL 5 MG PO TABS
5.0000 mg | ORAL_TABLET | ORAL | Status: DC | PRN
  Administered 2018-02-19: 5 mg via ORAL
  Filled 2018-02-19 (×3): qty 1

## 2018-02-19 MED ORDER — OXYCODONE-ACETAMINOPHEN 5-325 MG PO TABS
1.0000 | ORAL_TABLET | ORAL | Status: DC | PRN
  Administered 2018-02-20 – 2018-02-22 (×12): 2 via ORAL
  Filled 2018-02-19 (×12): qty 2

## 2018-02-19 MED ORDER — FENTANYL CITRATE (PF) 100 MCG/2ML IJ SOLN
INTRAMUSCULAR | Status: DC | PRN
  Administered 2018-02-19: 15 ug via INTRAVENOUS

## 2018-02-19 MED ORDER — KETOROLAC TROMETHAMINE 30 MG/ML IJ SOLN: 30.0000 mg | Freq: Four times a day (QID) | INTRAMUSCULAR | Status: DC

## 2018-02-19 MED ORDER — MEPERIDINE HCL 25 MG/ML IJ SOLN: 6.2500 mg | INTRAMUSCULAR | Status: DC | PRN

## 2018-02-19 MED ORDER — LIDOCAINE 5 % EX PTCH
1.0000 | MEDICATED_PATCH | CUTANEOUS | Status: DC
  Filled 2018-02-19: qty 1

## 2018-02-19 MED ORDER — SIMETHICONE 80 MG PO CHEW
80.0000 mg | CHEWABLE_TABLET | ORAL | Status: DC
  Administered 2018-02-20 – 2018-02-22 (×3): 80 mg via ORAL
  Filled 2018-02-19 (×3): qty 1

## 2018-02-19 MED ORDER — OXYCODONE HCL 5 MG PO TABS
10.0000 mg | ORAL_TABLET | ORAL | Status: DC | PRN
  Administered 2018-02-19 – 2018-02-20 (×2): 10 mg via ORAL
  Filled 2018-02-19 (×3): qty 2

## 2018-02-19 MED ORDER — NALOXONE HCL 0.4 MG/ML IJ SOLN: 0.4000 mg | INTRAMUSCULAR | Status: DC | PRN

## 2018-02-19 MED ORDER — SODIUM CHLORIDE 0.9% FLUSH: 3.0000 mL | INTRAVENOUS | Status: DC | PRN

## 2018-02-19 MED ORDER — LACTATED RINGERS IV SOLN
INTRAVENOUS | Status: DC
  Administered 2018-02-19: 07:00:00 via INTRAVENOUS

## 2018-02-19 SURGICAL SUPPLY — 27 items
BAG COUNTER SPONGE EZ (MISCELLANEOUS) ×2 IMPLANT
BENZOIN TINCTURE PRP APPL 2/3 (GAUZE/BANDAGES/DRESSINGS) ×1 IMPLANT
CANISTER SUCT 3000ML PPV (MISCELLANEOUS) ×2 IMPLANT
CHLORAPREP W/TINT 26ML (MISCELLANEOUS) ×4 IMPLANT
COVER WAND RF STERILE (DRAPES) ×2 IMPLANT
DRAPE C-SECTION (MISCELLANEOUS) ×1 IMPLANT
DRSG TELFA 3X8 NADH (GAUZE/BANDAGES/DRESSINGS) ×2 IMPLANT
ELECT REM PT RETURN 9FT ADLT (ELECTROSURGICAL) ×2
ELECTRODE REM PT RTRN 9FT ADLT (ELECTROSURGICAL) ×1 IMPLANT
GAUZE SPONGE 4X4 12PLY STRL (GAUZE/BANDAGES/DRESSINGS) ×2 IMPLANT
GLOVE BIO SURGEON STRL SZ 6.5 (GLOVE) ×2 IMPLANT
GLOVE INDICATOR 7.0 STRL GRN (GLOVE) ×2 IMPLANT
GOWN STRL REUS W/ TWL LRG LVL3 (GOWN DISPOSABLE) ×2 IMPLANT
GOWN STRL REUS W/TWL LRG LVL3 (GOWN DISPOSABLE) ×2
HEMOSTAT SURGICEL 2X3 (HEMOSTASIS) ×2 IMPLANT
KIT TURNOVER KIT A (KITS) ×2 IMPLANT
NS IRRIG 1000ML POUR BTL (IV SOLUTION) ×2 IMPLANT
PACK C SECTION AR (MISCELLANEOUS) ×2 IMPLANT
PAD DRESSING TELFA 3X8 NADH (GAUZE/BANDAGES/DRESSINGS) ×1 IMPLANT
PAD OB MATERNITY 4.3X12.25 (PERSONAL CARE ITEMS) ×2 IMPLANT
PAD PREP 24X41 OB/GYN DISP (PERSONAL CARE ITEMS) ×2 IMPLANT
STRIP CLOSURE SKIN 1/2X4 (GAUZE/BANDAGES/DRESSINGS) ×1 IMPLANT
SUT MNCRL AB 4-0 PS2 18 (SUTURE) ×2 IMPLANT
SUT PLAIN 2 0 XLH (SUTURE) IMPLANT
SUT VIC AB 0 CT1 36 (SUTURE) ×8 IMPLANT
SUT VIC AB 3-0 SH 27 (SUTURE) ×1
SUT VIC AB 3-0 SH 27X BRD (SUTURE) ×1 IMPLANT

## 2018-02-19 NOTE — Op Note (Signed)
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  Post-operative Diagnosis: Same  Surgeon: Hildred Laser, MD  Assistants: Sharon Seller, MD.  No other capable assistant available in surgery requiring high level assistant.  Procedure: Repeat low transverse Cesarean Section with bilateral tubal ligation.  Anesthesia: Spinal anesthesia  Findings:  Female infant, cephalic presentation, 3020 grams, with Apgar scores of 8 at one minute and 9 at five minutes. Intact placenta with 3 vessel cord.  Clear amniotic fluid at amniotomy. Uterine adhesions to the peritoneum and anterior abdominal wall. Bladder adhesions to the lower uterine segment.  Fallopian tubes and ovaries appeared normal.    Procedure Details: The patient was seen in the Holding Room. The risks, benefits, complications, treatment options, and expected outcomes were discussed with the patient.  The patient concurred with the proposed plan, giving informed consent.  The site of surgery properly noted/marked. The patient was taken to the Operating Room, identified as [name] and the procedure verified as C-Section Delivery. A Time Out was held and the above information confirmed.  After induction of anesthesia, the patient was draped and prepped in the usual sterile manner. Anesthesia was tested and noted to be adequate. A Pfannenstiel incision was made and carried down through the subcutaneous tissue to the fascia. Fascial incision was made and extended transversely. The fascia was separated from the underlying rectus tissue superiorly and inferiorly. The peritoneum was identified and entered. Peritoneal incision was extended longitudinally. Moderate peritoneal adhesions to the  anterior surface of the uterus was noted. The upper anterior surface up to the fundus was also noted to have adhesions to the anterior abdominal wall.  These adhesions were lysed using the bovie. The surgical assist was able to provide retraction to allow for clear visualization of surgical site. Due to adhesions of the bladder to the lower uterine segment, no bladder flap was performed.  A low transverse uterine incision was made. Delivered from cephalic presentation was a 3020 gram Female with Apgar scores of 8 at one minute and 9 at five minutes.  The assistant was able to apply adequate fundal pressure to allow for successful delivery of the fetus. After the umbilical cord was clamped and cut cord blood was obtained for evaluation. The placenta was removed intact and appeared normal. The uterus was exteriorized and cleared of all clots and debris. The above findings were noted.  The uterine incision was closed with running a locked suture of 0-Vicryl.  A second suture of 0-Vicryl was used in an imbricating layer for the middle of the incision to achieve hemostasis.  Hemostasis was observed.   Attention was then turned to the fallopian tubes, and where the patient's right fallopian tube was identified and grasped with a Babcock clamp.  The tube was then followed out to the vimbria.  The Babcock clamp was then used to grasp the tube approximately 4 cm from the cornual region.  A 3 cm segment of tube was then ligated with a free tie of 0-Chromic using the [place] method and excised.  The left fallopian tube was then ligated in a similar fashion and excised. The tubal lumens were cauterized bilaterally.  Good hemostasis was noted with bilateral fallopian tubes. The uterus was then returned to the abdomen.   The pelvis was cleared  of all clots and debris. Areas where adhesiolysis had been performed on the anterior uterine surface appeared raw. Surgicele was placed over these regions as well as the uterine incision  with continued hemostasis noted. The muscle layer was reapproximated with interrupted sutures of 3-0 Vicryl. The fascia was then reapproximated with a running suture of 0-Vicryl. The subcutaneous fat layer was reapproximated with 3-0 Vicryl.  The skin was reapproximated with 4-0 Monocryl.  Instrument, sponge, and needle counts were correct prior the abdominal closure and at the conclusion of the case.   Estimated Blood Loss:  500 ml      Drains: foley catheter to gravity drainage, 50 ml of clear urine at end of the procedure         Total IV Fluids:  700 ml  Specimens: Cord blood and bilateral segments of fallopian tubes were sent to pathology.          Implants: None         Complications:  None; patient tolerated the procedure well.         Disposition: PACU - hemodynamically stable.         Condition: stable   Hildred Laserherry, Chrissa Meetze, MD [facility]
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
## 2018-02-19 NOTE — ED Triage Notes (Signed)
Pt here for scheduled c-section; pt is due 02/26/18; positive fetal movement; denies abd pain/leaking fluid/bleeding; pt taken to LDR 5 via wheelchair by ED tech Dorian

## 2018-02-19 NOTE — Anesthesia Preprocedure Evaluation (Signed)
Anesthesia Evaluation  Patient identified by MRN, date of birth, ID band Patient awake    Reviewed: Allergy & Precautions, NPO status , [name] Chart, lab work & pertinent test results  History of Anesthesia Complications Negative for: history of anesthetic complications  Airway Mallampati: III  TM Distance: >3 FB Neck ROM: Full    Dental no notable dental hx.    Pulmonary neg sleep apnea, neg COPD, Current Smoker,    breath sounds clear to auscultation- rhonchi (-) wheezing      Cardiovascular (-) hypertension(-) CAD, (-) Past MI, (-) Cardiac Stents and (-) CABG  Rhythm:Regular Rate:Normal - Systolic murmurs and - Diastolic murmurs    Neuro/Psych  Headaches, Seizures - (last seizure at start of pregnancy when pt started chantix, stopped keppra a few months ago because she didn't like how it made her feel),  PSYCHIATRIC DISORDERS Anxiety Depression    GI/Hepatic Neg liver ROS, GERD  ,  Endo/Other  negative endocrine ROSneg diabetes  Renal/GU negative Renal ROS     Musculoskeletal negative musculoskeletal ROS (+)   Abdominal (+) + obese,   Peds  Hematology  (+) anemia ,   Anesthesia Other Findings Past Medical History: No date: Depression 02/2018: Diabetes mellitus without complication (HCC)     Comment:  hx of gestational diabetes with previous pregnancies.               not diagnosed thistime No date: GBS bacteriuria No date: GERD (gastroesophageal reflux disease) No date: Hx of preeclampsia, prior pregnancy, currently pregnant No date: Hypertension     Comment:  hx of eclampsia No date: IUGR (intrauterine growth restriction) in prior pregnancy,  pregnant No date: Obesity (BMI 35.0-39.9 without comorbidity) 02/2018: Seizures (HCC)     Comment:  presents as confused, passes out. last seizure was 3               months ago.  stopped taking keppra   Reproductive/Obstetrics (+) Pregnancy                              Lab Results  Component Value Date   WBC 9.2 02/16/2018   HGB 10.0 (L) 02/16/2018   HCT 31.7 (L) 02/16/2018   MCV 88.1 02/16/2018   PLT 247 02/16/2018    Anesthesia Physical Anesthesia Plan  ASA: II  Anesthesia Plan: Spinal   Post-op Pain Management:    Induction:   PONV Risk Score and Plan: 1 and Ondansetron  Airway Management Planned: Natural Airway  Additional Equipment:   Intra-op Plan:   Post-operative Plan:   Informed Consent: I have reviewed the patients History and Physical, chart, labs and discussed the procedure including the risks, benefits and alternatives for the proposed anesthesia with the patient or authorized representative who has indicated his/her understanding and acceptance.   Dental advisory given  Plan Discussed with: CRNA and Anesthesiologist  Anesthesia Plan Comments:         Anesthesia Quick Evaluation

## 2018-02-19 NOTE — OB Triage Note (Signed)
Patient arrived to L&D for her scheduled repeat c-section.

## 2018-02-19 NOTE — Lactation Note (Signed)
This note was copied from a baby's chart. Lactation Consultation Note  Patient Name: [name] ZOXWR'UToday's Date: 02/19/2018 Reason for consult: Initial assessment;Term;1st time breastfeeding Mom breast fed Nazir first time after delivery fairly well.  When demonstrated hand expression, got lots of colostrum.  This is mom's 4th baby, but the first time she has opted to breast feed.  Praised mom for her commitment and ease with breast feeding.  Mom reports she wants to start pumping right away because she has to go back to work pretty soon.  Discussed risks to successful breast feeding if she started supplementing too early with bottles.  Encouraged mom to let Nazir get breast feeding well established before introducing bottles.  Mom agreed to hold off on bottles, but still chooses to start pumping right away.  She already has a Medela Pump In Style at home.  Told mom LC would follow up with pumping once she was in her room on mother/baby unit.  Discussed supply and demand, normal course of lactation and routine newborn feeding patterns.      Maternal Data Formula Feeding for Exclusion: No Has patient been taught Hand Expression?: Yes(Can easily hand express lots of colostrum) Does the patient have breastfeeding experience prior to this delivery?: No(Mom has 4 children now and this is her first one to breast feed)  Feeding Feeding Type: Breast Milk  LATCH Score Latch: Grasps breast easily, tongue down, lips flanged, rhythmical sucking.  Audible Swallowing: Spontaneous and intermittent  Type of Nipple: Everted at rest and after stimulation  Comfort (Breast/Nipple): Soft / non-tender  Hold (Positioning): Assistance needed to correctly position infant at breast and maintain latch.  LATCH Score: 9  Interventions Interventions: Breast feeding basics reviewed;Assisted with latch;Reverse pressure;Skin to skin;Breast compression;Breast massage;Adjust position;Support pillows  Lactation Tools  Discussed/Used WIC Program: Yes   Consult Status Consult Status: Follow-up(Mom wants to pump asap - will f/u once gets to room on M/B unit) Date: 02/19/18 Follow-up type: Call as needed    Louis MeckelWilliams, Addisen Chappelle Kay 02/19/2018, 2:00 PM

## 2018-02-19 NOTE — Lactation Note (Signed)
This note was copied from a baby's chart. Lactation Consultation Note  Patient Name: Boy Manley MasonChevy Valenza ZOXWR'UToday's Date: 02/19/2018 Reason for consult: Initial assessment;Term;1st time breastfeeding   Maternal Data Formula Feeding for Exclusion: No Has patient been taught Hand Expression?: Yes(Can easily hand express lots of colostrum) Does the patient have breastfeeding experience prior to this delivery?: No(Mom has 4 children now and this is her first one to breast feed)  Feeding Feeding Type: Breast Milk  LATCH Score Latch: Grasps breast easily, tongue down, lips flanged, rhythmical sucking.  Audible Swallowing: Spontaneous and intermittent  Type of Nipple: Everted at rest and after stimulation  Comfort (Breast/Nipple): Soft / non-tender  Hold (Positioning): Assistance needed to correctly position infant at breast and maintain latch.  LATCH Score: 9  Interventions Interventions: Breast feeding basics reviewed;Assisted with latch;Reverse pressure;Skin to skin;Breast compression;Breast massage;Adjust position;Support pillows  Lactation Tools Discussed/Used WIC Program: Yes   Consult Status Consult Status: Follow-up(Mom wants to pump asap - will f/u once gets to room on M/B unit) Date: 02/19/18 Follow-up type: Call as needed MOB is currently expressing milk for baby who is in SCN. Explained to mom that she will get more out with hand expressing into medicine cup as opposed to pumping for right now, but pumping will keep her body programmed to continue increasing milk supply and can do both at the same time. (Mom prefers DEBP, but hand expression was working better.). Mom has a DEBP at home. Explained clusterfeeding for when baby can go to breast as well as how to establish and maintain a milk supply.    Burnadette PeterJaniya M Eira Alpert 02/19/2018, 2:09 PM

## 2018-02-19 NOTE — Transfer of Care (Signed)
Immediate Anesthesia Transfer of Care Note  Patient: Emily MannersChevy C Swigart  Procedure(s) Performed: REPEAT CESAREAN SECTION WITH BILATERAL TUBAL LIGATION (Bilateral Abdomen)  Patient Location: Mother/Baby  Anesthesia Type:Spinal  Level of Consciousness: awake, alert  and oriented  Airway & Oxygen Therapy: Patient Spontanous Breathing  Post-op Assessment: Report given to RN and Post -op Vital signs reviewed and stable  Post vital signs: Reviewed and stable  Last Vitals:  Vitals Value Taken Time  BP 105/73 02/19/2018  9:21 AM  Temp    Pulse 73 02/19/2018  9:21 AM  Resp 18 02/19/2018  9:21 AM  SpO2 99 % 02/19/2018  9:21 AM    Last Pain:  Vitals:   02/19/18 0709  TempSrc: Axillary  PainSc: 0-No pain         Complications: No apparent anesthesia complications

## 2018-02-19 NOTE — Anesthesia Procedure Notes (Signed)
Spinal  Patient location during procedure: OR Start time: 02/19/2018 7:44 AM End time: 02/19/2018 7:49 AM Staffing Anesthesiologist: Emmie Niemann, MD Resident/CRNA: Jonna Clark, CRNA Performed: resident/CRNA  Preanesthetic Checklist Completed: patient identified, site marked, surgical consent, pre-op evaluation, timeout performed, IV checked, risks and benefits discussed and monitors and equipment checked Spinal Block Patient position: sitting Prep: Betadine Patient monitoring: heart rate, continuous pulse ox and blood pressure Approach: midline Location: L4-5 Injection technique: single-shot Needle Needle type: Introducer and Pencil-Tip  Needle gauge: 24 G Needle length: 9 cm Additional Notes Negative paresthesia. Negative blood return. Positive free-flowing CSF. Expiration date of kit checked and confirmed. Patient tolerated procedure well, without complications.

## 2018-02-19 NOTE — Anesthesia Post-op Follow-up Note (Signed)
Anesthesia QCDR form completed.        
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
## 2018-02-20 ENCOUNTER — Encounter: Payer: Self-pay | Admitting: Obstetrics and Gynecology

## 2018-02-20 LAB — CBC
HCT: 24.5 % — ABNORMAL LOW (ref 36.0–46.0)
Hemoglobin: 7.7 g/dL — ABNORMAL LOW (ref 12.0–15.0)
MCH: 27.7 pg (ref 26.0–34.0)
MCHC: 31.4 g/dL (ref 30.0–36.0)
MCV: 88.1 fL (ref 80.0–100.0)
Platelets: 197 10*3/uL (ref 150–400)
RBC: 2.78 MIL/uL — ABNORMAL LOW (ref 3.87–5.11)
RDW: 14.2 % (ref 11.5–15.5)
WBC: 10.3 10*3/uL (ref 4.0–10.5)
nRBC: 0 % (ref 0.0–0.2)

## 2018-02-20 LAB — CREATININE, SERUM
Creatinine, Ser: 0.85 mg/dL (ref 0.44–1.00)
GFR calc Af Amer: >60 mL/min (ref >60)
GFR calc non Af Amer: >60 mL/min (ref >60)

## 2018-02-20 LAB — SURGICAL PATHOLOGY

## 2018-02-20 NOTE — Lactation Note (Signed)
This note was copied from a baby's chart. Lactation Consultation Note  Patient Name: Emily Manley MasonChevy Bourassa ZOXWR'UToday's Date: 02/20/2018   I saw this mom this afternoon. She c/o "no milk" when she pumps. It sounds like she has  Been intermittent with pumping but claims she is eager to have milk. She had 27 and 30 mm flanges. I reviewed the process of how milk her milk supply works and that only she is control of the process of frequent hand expression and pumping. I encouraged her to try the "My NICU Baby App to track pumpings.etc. I then reviewed how to hand express her milk. Very quickly I was able to obtain a slow flow of colostrum . I then had her practice and she soon started to obtain more as well. I then reviewed her pump with her. She had not been adjusting the suction so we worked on that. Today, the 27 flanges look like the best fir for both nipples with a little room around each as she pumps. I did encouraged hands on pumping when able and always try hand expressions before and after pump, at least until her supply builds up better. I also encouraged a lot of skin to skin as medically able as baby is in SCN.      Maternal Data    Feeding Feeding Type: Donor Breast Milk  LATCH Score                   Interventions    Lactation Tools Discussed/Used     Consult Status      Sunday CornSandra Clark Elaya Droege 02/20/2018, 5:17 PM

## 2018-02-20 NOTE — Anesthesia Post-op Follow-up Note (Signed)
  Anesthesia Pain Follow-up Note  Patient: Emily MannersChevy C Loconte  Day #: 1  Date of Follow-up: 02/20/2018 Time: 11:39 AM  Last Vitals:  Vitals:   02/20/18 0816 02/20/18 1125  BP:  140/90  Pulse: 100 93  Resp:  20  Temp:  36.7 C  SpO2: 100% 99%    Level of Consciousness: alert  Pain: moderate   Side Effects:None  Catheter Site Exam:clean, dry  Anti-Coag Meds (From admission, onward)   Start     Dose/Rate Route Frequency Ordered Stop   02/20/18 0800  enoxaparin (LOVENOX) injection 40 mg     40 mg Subcutaneous Every 24 hours 02/19/18 1101         Plan: D/C from anesthesia care at surgeon's request  Margareth Kanner,  Alessandra BevelsJennifer M

## 2018-02-20 NOTE — Progress Notes (Signed)
Patient left the unit without this nurse knowledge. When she returned, she reported needing a wheelchair once downstairs because she walked without assistance downstairs. Educated pt on risks with leaving the unit to include risk of bleeding/injury, etc. Pt and significant other voiced understanding. Later on patient was observed again leaving the unit, but was using a wheelchair this time. Will continue to monitor.

## 2018-02-20 NOTE — Anesthesia Postprocedure Evaluation (Signed)
Anesthesia Post Note  Patient: [name]  Procedure(s) Performed: REPEAT CESAREAN SECTION WITH BILATERAL TUBAL LIGATION (Bilateral Abdomen)  Patient location during evaluation: Mother Baby Anesthesia Type: Spinal Level of consciousness: oriented and awake and alert Pain management: pain level controlled Vital Signs Assessment: post-procedure vital signs reviewed and stable Respiratory status: spontaneous breathing and respiratory function stable Cardiovascular status: blood pressure returned to baseline and stable Postop Assessment: no headache, no backache, no apparent nausea or vomiting and able to ambulate Anesthetic complications: no     Last Vitals:  Vitals:   02/20/18 0816 02/20/18 1125  BP:  140/90  Pulse: 100 93  Resp:  20  Temp:  36.7 C  SpO2: 100% 99%    Last Pain:  Vitals:   02/20/18 1125  TempSrc: Oral  PainSc:                  Rica MastBachich,  Jerzy Crotteau M

## 2018-02-20 NOTE — Progress Notes (Signed)
Postpartum Day # 1: Cesarean Delivery (repeat) with tubal ligation  Subjective: Patient reports tolerating PO and + flatus.  Has ambulated without difficulty.  Has not yet voided without the catheter but feels the urge to go.  Objective: Vital signs in last 24 hours: Temp:  [97 F (36.1 C)-98.5 F (36.9 C)] 97.6 F (36.4 C) (12/10 0305) Pulse Rate:  [70-111] 92 (12/10 0500) Resp:  [0-22] 18 (12/10 0305) BP: (99-130)/(50-96) 113/60 (12/10 0305) SpO2:  [98 %-100 %] 99 % (12/10 0500)  I/O last 3 completed shifts: In: 1807.6 [I.V.:849; IV Piggyback:958.6] Out: 1950 [Urine:1450; Blood:500] No intake/output data recorded.   Physical Exam:  General: alert and no distress Lungs: clear to auscultation bilaterally Breasts: normal appearance, no masses or tenderness Heart: regular rate and rhythm, S1, S2 normal, no murmur, click, rub or gallop Abdomen: soft, non-tender; bowel sounds normal; no masses,  no organomegaly Pelvis: Lochia appropriate, Uterine Fundus firm, Incision: healing well, no significant drainage, no dehiscence, no significant erythema Extremities: DVT Evaluation: No evidence of DVT seen on physical exam. No cords or calf tenderness. No significant calf/ankle edema.  Recent Labs    02/20/18 0628  HGB 7.7*  HCT 24.5*    Assessment/Plan: Status post Cesarean section. Doing well postoperatively.  Breastfeeding, Lactation consult,  Circumcision after discharge  Contraception intraoperative BTL Regular diet as tolerated Continue PO pain management Discontinue IVF.  Plan for discharge tomorrow Continue current care.  Hildred LaserAnika Aidan Moten [facility]

## 2018-02-21 LAB — RNA QUALITATIVE: HIV 1 RNA Qualitative: 1

## 2018-02-21 LAB — HIV-1/2 AB - DIFFERENTIATION
HIV 1 AB: NEGATIVE
HIV 2 Ab: NEGATIVE
NOTE (HIV CONF MULTISPOT): NEGATIVE

## 2018-02-21 NOTE — Lactation Note (Signed)
This note was copied from a baby's chart. Lactation Consultation Note  Patient Name: [name] NFAOZ'HToday's Date: 02/21/2018 Reason for consult: Initial assessment   Maternal Data Has patient been taught Hand Expression?: Yes  Feeding Feeding Type: Donor Breast Milk Nipple Type: Slow - flow  LATCH Score                   Interventions    Lactation Tools Discussed/Used Tools: Bottle;14F feeding tube / Syringe WIC Program: Yes   Consult Status  Mother has questions on pumping techniques. LC answered her questions and talked about frequency of pumping (pump every 2-3 hours) and gave her pumping tips and made sure her flanges were fitting correctly. Mother denies additional concerns.    [name] 02/21/2018, 1:43 PM

## 2018-02-21 NOTE — Clinical Social Work Maternal (Signed)
  CLINICAL SOCIAL WORK MATERNAL/CHILD NOTE  Patient Details  Name: Emily MannersChevy C Coburn MRN: [id number] Date of Birth: June 07, 1990  Date:  02/21/2018  Clinical Social Worker Initiating Note:  York SpanielMonica Karanvir Balderston MSW,LCSW Date/Time: Initiated:  02/21/18/      Child's Name:      Biological Parents:  Mother, Father   Need for Interpreter:  None   Reason for Referral:  Other (Comment)(potential resources for supplies)   Address:  [address] [place] [address]    Phone number:  [phone] (home)     Additional phone number: none  Household Members/Support Persons (HM/SP):       HM/SP Name Relationship DOB or Age  HM/SP -1        HM/SP -2        HM/SP -3        HM/SP -4        HM/SP -5        HM/SP -6        HM/SP -7        HM/SP -8          Natural Supports (not living in the home):      Professional Supports: Case Manager/Social [facility]   Employment:     Type of Work:     Education:      Homebound arranged:    Surveyor, quantityinancial Resources:  Medicaid   Other Resources:      Cultural/Religious Considerations Which May Impact Care:  none  Strengths:  Ability to meet basic needs , Compliance with medical plan , Home prepared for child , Understanding of illness   Psychotropic Medications:         Pediatrician:       [place]:   [place]    [place]    [place]    [place]    [place]    [place]      Pediatrician Fax Number:    Risk Factors/Current Problems:  None   Cognitive State:  Alert , Able to Concentrate    Mood/Affect:  Bright , Happy    CSW Assessment: CSW spoke with patient at bedside while she was breastfeeding. Patient reports she has all supplies for her newborn and that this is her fourth. She states in the home are her other 3 children and her significant other. Patient reports she has plenty of financial and emotional support. She stated that she had stated she would  welcome any additional resources and patient stated that her admitting nurse stated that she would put a CSW consult in. CSW offered additional resources however patient stated she had what she needed. She stated she had heard that the hospital gives away pack n plays but she states she already has a crib and a bassinet.   CSW inquired if during her prenatal care, she received postpartum depression education. Patient did receive this. Patient had not questions or concerns.  CSW Plan/Description:  No Further Intervention Required/No Barriers to Discharge    York SpanielMonica Shaylynne Lunt, LCSW 02/21/2018, 11:54 AM

## 2018-02-21 NOTE — Progress Notes (Signed)
Postpartum Day # 2: Cesarean Delivery (repeat) with tubal ligation  Subjective: Patient reports tolerating PO, + flatus, + BM and no problems voiding.  Has ambulated and tolerating regular diet.  Objective: Vital signs in last 24 hours: Vitals:   02/20/18 1958 02/20/18 2347 02/21/18 0323 02/21/18 0816  BP: 124/75 117/61 125/78 125/82  Pulse: 100 87 (!) 101 99  Resp: 18 18    Temp: 98.3 F (36.8 C) 97.7 F (36.5 C) 98.3 F (36.8 C) 98.4 F (36.9 C)  TempSrc: Oral Oral Oral Oral  SpO2: 100% 100% 94% 97%  Weight:      Height:        Physical Exam:  General: alert and no distress Lungs: clear to auscultation bilaterally Breasts: normal appearance, no masses or tenderness Heart: regular rate and rhythm, S1, S2 normal, no murmur, click, rub or gallop Abdomen: soft, non-tender; bowel sounds normal; no masses,  no organomegaly Pelvis: Lochia appropriate, Uterine Fundus firm, Incision: healing well, no significant drainage, no dehiscence, no significant erythema Extremities: DVT Evaluation: No evidence of DVT seen on physical exam. No cords or calf tenderness. No significant calf/ankle edema.  Recent Labs    02/20/18 0628  HGB 7.7*  HCT 24.5*    Assessment/Plan: Status post Cesarean section. Doing well postoperatively.  Breastfeeding, s/p Lactation consult Circumcision after discharge  Contraception intraoperative BTL Regular diet as tolerated Continue PO pain management Anemia postpartum, moderate. Patient with anemia in pregnancy.  Will treat with PO supplementation.  Plan for discharge tomorrow (infant still in special care nursery for tachypnea). Continue current care.  Hildred LaserAnika Dai Mcadams [facility]

## 2018-02-22 MED ORDER — DOCUSATE SODIUM 100 MG PO CAPS: 100.0000 mg | ORAL_CAPSULE | Freq: Two times a day (BID) | ORAL | 2 refills | Status: AC | PRN

## 2018-02-22 MED ORDER — FERROUS SULFATE 325 (65 FE) MG PO TABS: 325.0000 mg | ORAL_TABLET | Freq: Two times a day (BID) | ORAL | 3 refills | Status: AC

## 2018-02-22 MED ORDER — IBUPROFEN 800 MG PO TABS: 800.0000 mg | ORAL_TABLET | Freq: Three times a day (TID) | ORAL | 1 refills | Status: AC | PRN

## 2018-02-22 MED ORDER — OXYCODONE-ACETAMINOPHEN 5-325 MG PO TABS: 1.0000 | ORAL_TABLET | ORAL | 0 refills | Status: DC | PRN

## 2018-02-22 NOTE — Discharge Summary (Signed)
OB Discharge Summary     Patient Name: [name] DOB: 11-16-1990 MRN: [id number]  Date of admission: 02/19/2018 Delivering MD: Hildred Laser   Date of discharge: 02/22/2018  Admitting diagnosis: PRIOR C SECTION X3, DESIRES STERILIZATION Intrauterine pregnancy: [redacted]w[redacted]d     Secondary diagnosis:  Active Problems:   Supervision of high risk pregnancy in third trimester   History of 3 cesarean sections   Obesity in pregnancy   Tobacco abuse   Seizure disorder (HCC)   Tobacco use disorder, mild, in early remission   Anxiety and depression   Admission for tubal ligation   Anemia of pregnancy in third trimester  Additional problems: None     Discharge diagnosis: Term Pregnancy Delivered and Anemia                                                                                                Post partum procedures:postpartum tubal ligation  Augmentation: None  Complications: None  Hospital course:  Sceduled C/S   27 y.o. yo [id number] at [redacted]w[redacted]d was admitted to the hospital 02/19/2018 for scheduled cesarean section with bilateral tubal ligation with the following indication:Elective Repeat and undesired fertility.  Membrane Rupture Time/Date:   ,    Patient delivered a Viable infant.02/19/2018  Details of operation can be found in separate operative note.  Pateint had an uncomplicated postpartum course.  She is ambulating, tolerating a regular diet, passing flatus, and urinating well. Patient is discharged home in stable condition on  02/22/18         Physical exam  Vitals:   02/21/18 2205 02/22/18 0001 02/22/18 0333 02/22/18 0806  BP: (!) 151/95 135/83 134/89 118/85  Pulse: (!) 113 96 (!) 103 91  Resp: 16 16 16 16   Temp: 98.3 F (36.8 C) 97.6 F (36.4 C) 97.9 F (36.6 C) 98.3 F (36.8 C)  TempSrc: Oral Oral Oral Oral  SpO2: 100% 100% 97% 98%  Weight:      Height:       General: alert and no distress Lochia: appropriate Uterine Fundus: firm Incision: Healing well with  no significant drainage, No significant erythema, Dressing is clean, dry, and intact DVT Evaluation: No evidence of DVT seen on physical exam. Negative Homan's sign. No cords or calf tenderness. No significant calf/ankle edema. Labs: Lab Results  Component Value Date   WBC 10.3 02/20/2018   HGB 7.7 (L) 02/20/2018   HCT 24.5 (L) 02/20/2018   MCV 88.1 02/20/2018   PLT 197 02/20/2018   CMP Latest Ref Rng & Units 02/20/2018  Glucose 70 - 99 mg/dL -  BUN 6 - 20 mg/dL -  Creatinine 1.30 - 8.65 mg/dL 7.84  Sodium 696 - 295 mmol/L -  Potassium 3.5 - 5.1 mmol/L -  Chloride 98 - 111 mmol/L -  CO2 22 - 32 mmol/L -  Calcium 8.9 - 10.3 mg/dL -  Total Protein 6.5 - 8.1 g/dL -  Total Bilirubin 0.3 - 1.2 mg/dL -  Alkaline Phos 38 - 284 U/L -  AST 15 - 41 U/L -  ALT 0 - 44 U/L -  Discharge instruction: per After Visit Summary and "Baby and Me Booklet".  After visit meds:  Allergies as of 02/22/2018   No Known Allergies     Medication List    STOP taking these medications   acetaminophen 325 MG tablet Commonly known as:  TYLENOL     TAKE these medications   CONCEPT OB 130-92.4-1 MG Caps Take 1 capsule by mouth daily.   docusate sodium 100 MG capsule Commonly known as:  COLACE Take 1 capsule (100 mg total) by mouth 2 (two) times daily as needed.   ferrous sulfate 325 (65 FE) MG tablet Take 1 tablet (325 mg total) by mouth 2 (two) times daily with a meal.   hydrOXYzine 50 MG capsule Commonly known as:  VISTARIL Take 1 capsule (50 mg total) by mouth at bedtime as needed.   ibuprofen 800 MG tablet Commonly known as:  ADVIL,MOTRIN Take 1 tablet (800 mg total) by mouth every 8 (eight) hours as needed.   levETIRAcetam 500 MG tablet Commonly known as:  KEPPRA Take 1 tablet (500 mg total) by mouth 2 (two) times daily.   oxyCODONE-acetaminophen 5-325 MG tablet Commonly known as:  PERCOCET/ROXICET Take 1-2 tablets by mouth every 4 (four) hours as needed for moderate pain or  severe pain.   sertraline 50 MG tablet Commonly known as:  ZOLOFT Take 1 tablet (50 mg total) by mouth daily.       Diet: routine diet  Activity: Advance as tolerated. Pelvic rest for 6 weeks.   Outpatient follow up:1 week, incision check Follow up Appt:No future appointments. Follow up Visit:No follow-ups on file.  Postpartum contraception: Tubal Ligation  Newborn Data: Live born female  Birth Weight: 6 lb 10.5 oz (3020 g) APGAR: 8, 9  Newborn Delivery   Birth date/time:  02/19/2018 08:18:00 Delivery type:  C-Section, Low Transverse Trial of labor:  No C-section categorization:  Repeat     Baby Feeding: Bottle and Breast Disposition:NICU   02/22/2018 Hildred LaserAnika Kynadie Yaun, MD  [facility]

## 2018-02-23 ENCOUNTER — Telehealth: Payer: Self-pay | Admitting: Surgical

## 2018-02-23 NOTE — Telephone Encounter (Signed)
Spoke with patient and she stated that she is still having edema in bil feet. Per Dr. Valentino Saxonherry this is normal. Relayed message from Dr. Valentino Saxonherry to watch for symptoms of RUQ abdominal pain, headaches that does not go away with tylenol, and vision changes that is a blanket going down. Patient verbalized understanding. She has appointment to come in Tuesday 02/27/18.

## 2018-02-27 ENCOUNTER — Encounter: Payer: Medicaid Other | Admitting: Obstetrics and Gynecology

## 2018-02-27 ENCOUNTER — Telehealth: Payer: Self-pay | Admitting: Obstetrics and Gynecology

## 2018-02-27 NOTE — Telephone Encounter (Signed)
[place] and there are records in the front office; however, the patient called a few moments ago, and she is asking for a call back from either her nurse, or Dr. Valentino Saxonherry, and please call her cell phone as there is no phone in the ICU, per the patient, please advise.  Thanks.

## 2018-02-28 NOTE — Telephone Encounter (Signed)
Pt called and speak with her concerning paper work sent to the office. Pt was informed that after she was discharged from the hospital to [facility] Pt stated that she understood.

## 2018-03-05 ENCOUNTER — Encounter: Payer: Medicaid Other | Admitting: Obstetrics and Gynecology

## 2018-03-12 ENCOUNTER — Encounter: Payer: Self-pay | Admitting: Obstetrics and Gynecology

## 2018-03-12 ENCOUNTER — Ambulatory Visit (INDEPENDENT_AMBULATORY_CARE_PROVIDER_SITE_OTHER): Payer: Medicaid Other | Admitting: Obstetrics and Gynecology

## 2018-03-12 VITALS — BP 141/98 | HR 74 | Wt 191.9 lb

## 2018-03-12 DIAGNOSIS — Z9889 Other specified postprocedural states: Secondary | ICD-10-CM
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
## 2018-03-12 NOTE — Progress Notes (Signed)
Patient comes in today for hospital follow up. Patient states that she is having headaches, leg cramps, and blurred vision since she left the hospital.

## 2018-03-20 ENCOUNTER — Encounter: Payer: Medicaid Other | Admitting: Obstetrics and Gynecology

## 2018-03-22 ENCOUNTER — Telehealth: Payer: Self-pay

## 2018-03-22 NOTE — Telephone Encounter (Signed)
Pt called no answer LM via voicemail to see if pt could come in at 3:45pm on tomorrow 03/23/18 instead of 4:30pm. Pt was advised to call the office to inform someone if she is able to take the the 3:45pm appt or stay at her 4:30pm appt.

## 2018-03-23 ENCOUNTER — Encounter: Payer: Self-pay | Admitting: Obstetrics and Gynecology

## 2018-04-04 ENCOUNTER — Encounter: Payer: Medicaid Other | Admitting: Obstetrics and Gynecology

## 2020-11-03 ENCOUNTER — Ambulatory Visit: Payer: Medicaid Other | Admitting: Neurology

## 2020-11-04 ENCOUNTER — Encounter: Payer: Self-pay | Admitting: Neurology
